# Patient Record
Sex: Female | Born: 1985 | Race: White | Hispanic: No | Marital: Single | Smoking: Former smoker
Health system: Southern US, Community
[De-identification: ages and names within clinical notes are randomized; demographics above are authoritative.]

## PROBLEM LIST (undated history)

## (undated) DIAGNOSIS — J45909 Unspecified asthma, uncomplicated: Secondary | ICD-10-CM

## (undated) DIAGNOSIS — K219 Gastro-esophageal reflux disease without esophagitis: Secondary | ICD-10-CM

## (undated) DIAGNOSIS — G43909 Migraine, unspecified, not intractable, without status migrainosus: Secondary | ICD-10-CM

## (undated) DIAGNOSIS — T7840XA Allergy, unspecified, initial encounter: Secondary | ICD-10-CM

## (undated) DIAGNOSIS — A64 Unspecified sexually transmitted disease: Secondary | ICD-10-CM

## (undated) DIAGNOSIS — F419 Anxiety disorder, unspecified: Secondary | ICD-10-CM

## (undated) DIAGNOSIS — I10 Essential (primary) hypertension: Secondary | ICD-10-CM

## (undated) HISTORY — PX: TONSILLECTOMY: SUR1361

## (undated) HISTORY — DX: Migraine, unspecified, not intractable, without status migrainosus: G43.909

## (undated) HISTORY — DX: Unspecified asthma, uncomplicated: J45.909

## (undated) HISTORY — DX: Allergy, unspecified, initial encounter: T78.40XA

## (undated) HISTORY — DX: Anxiety disorder, unspecified: F41.9

## (undated) HISTORY — DX: Unspecified sexually transmitted disease: A64

## (undated) HISTORY — DX: Gastro-esophageal reflux disease without esophagitis: K21.9

## (undated) HISTORY — DX: Essential (primary) hypertension: I10

---

## 2010-11-01 ENCOUNTER — Inpatient Hospital Stay (INDEPENDENT_AMBULATORY_CARE_PROVIDER_SITE_OTHER)
Admission: RE | Admit: 2010-11-01 | Discharge: 2010-11-01 | Disposition: A | Discharge status: Home / Self Care | Payer: BC Managed Care – PPO | Source: Ambulatory Visit | Attending: Family Medicine | Admitting: Family Medicine

## 2010-11-01 DIAGNOSIS — J029 Acute pharyngitis, unspecified: Secondary | ICD-10-CM

## 2011-10-18 ENCOUNTER — Ambulatory Visit: Payer: BC Managed Care – PPO | Admitting: Family Medicine

## 2011-10-18 VITALS — BP 128/79 | HR 103 | Temp 98.6°F | Resp 12 | Ht 65.0 in | Wt 136.0 lb

## 2011-10-18 DIAGNOSIS — IMO0001 Reserved for inherently not codable concepts without codable children: Secondary | ICD-10-CM

## 2011-10-18 DIAGNOSIS — IMO0002 Reserved for concepts with insufficient information to code with codable children: Secondary | ICD-10-CM

## 2011-10-18 DIAGNOSIS — T7589XA Other specified effects of external causes, initial encounter: Secondary | ICD-10-CM

## 2011-10-18 LAB — POCT WET PREP WITH KOH
Epithelial Wet Prep HPF POC: NEGATIVE
Trichomonas, UA: NEGATIVE
Yeast Wet Prep HPF POC: NEGATIVE

## 2011-10-18 LAB — POCT CBC
Granulocyte percent: 71.1 %G (ref 37–80)
HCT, POC: 39.3 % (ref 37.7–47.9)
Lymph, poc: 2.1 (ref 0.6–3.4)
MCH, POC: 29.4 pg (ref 27–31.2)
MCHC: 31.3 g/dL — AB (ref 31.8–35.4)
MCV: 93.7 fL (ref 80–97)
POC LYMPH PERCENT: 19.6 %L (ref 10–50)
RDW, POC: 14.9 %
WBC: 10.6 10*3/uL — AB (ref 4.6–10.2)

## 2011-10-18 MED ORDER — METRONIDAZOLE 500 MG PO TABS: 500.0000 mg | ORAL_TABLET | Freq: Two times a day (BID) | ORAL | Status: AC

## 2011-10-18 MED ORDER — DOXYCYCLINE HYCLATE 100 MG PO TABS: 100.0000 mg | ORAL_TABLET | Freq: Two times a day (BID) | ORAL | Status: AC

## 2011-10-18 MED ORDER — VALACYCLOVIR HCL 1 G PO TABS: 1000.0000 mg | ORAL_TABLET | Freq: Two times a day (BID) | ORAL | Status: DC

## 2011-10-18 MED ORDER — ONDANSETRON 8 MG PO TBDP: 8.0000 mg | ORAL_TABLET | Freq: Three times a day (TID) | ORAL | Status: AC | PRN

## 2011-10-18 MED ORDER — CEFTRIAXONE SODIUM 1 G IJ SOLR
250.0000 mg | Freq: Once | INTRAMUSCULAR | Status: AC
  Administered 2011-10-18: 250 mg via INTRAMUSCULAR

## 2011-10-18 NOTE — Progress Notes (Signed)
Subjective:    Patient ID: Brandi Navarro, female    DOB: [birth year], 26 y.o.   MRN: [id number]  HPI  1) Presents complaining of ST X 6 days. Looked at the back of her throat and noted "pus"     Pain swallowing.  2) Vaginal discharge, dysuria and pain.  Partner has multiple other partner. Does not use condoms.      Review of Systems  Constitutional: Positive for fever, chills and fatigue.  HENT: Positive for postnasal drip.        Objective:   Physical Exam  Constitutional: She appears well-developed.  HENT:  Mouth/Throat: Oral lesions (multiple ulcerative lesions (B) tonsils ) present.  Cardiovascular: Normal rate, regular rhythm and normal heart sounds.   Pulmonary/Chest: Effort normal and breath sounds normal.  Genitourinary: Uterus normal. There is rash (vesicular lesions) on the right labia. There is rash (vesicular lesions) on the left labia. Cervix exhibits friability. Cervix exhibits no motion tenderness. Right adnexum displays no mass and no tenderness. Left adnexum displays no mass and no tenderness. Vaginal discharge found.  Lymphadenopathy:    She has cervical adenopathy.       Right: Inguinal adenopathy present.       Left: Inguinal adenopathy present.     Results for orders placed in visit on 10/18/11  HIV ANTIBODY (ROUTINE TESTING)      Component Value Range   HIV NON REACTIVE  NON REACTIVE   GC/CHLAMYDIA PROBE AMP, GENITAL      Component Value Range   Chlamydia, DNA Probe NEGATIVE     GC Probe Amp, Genital NEGATIVE    POCT WET PREP WITH KOH      Component Value Range   Trichomonas, UA Negative     Clue Cells Wet Prep HPF POC tntc     Epithelial Wet Prep HPF POC neg     Yeast Wet Prep HPF POC neg     Bacteria Wet Prep HPF POC 5+     RBC Wet Prep HPF POC neg     WBC Wet Prep HPF POC tntc     KOH Prep POC Negative    RPR      Component Value Range   RPR NON REAC  NON REAC   HSV(HERPES SMPLX)ABS-I+II(IGG+IGM)-BLD      Component Value Range   HSV 1  Glycoprotein G Ab, IgG <0.10     HSV 2 Glycoprotein G Ab, IgG 0.16     Herpes Simplex Vrs I&II-IgM Ab (EIA) 1.88 (*)   HERPES SIMPLEX VIRUS CULTURE      Component Value Range   Organism ID, 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    POCT CBC      Component Value Range   WBC 10.6 (*) 4.6 - 10.2 (K/uL)   Lymph, poc 2.1  0.6 - 3.4    POC LYMPH PERCENT 19.6  10 - 50 (%L)   MID (cbc) 1.0 (*) 0 - 0.9    POC MID % 9.3  0 - 12 (%M)   POC Granulocyte 7.5 (*) 2 - 6.9    Granulocyte percent 71.1  37 - 80 (%G)   RBC 4.19  4.04 - 5.48 (M/uL)   Hemoglobin 12.3  12.2 - 16.2 (g/dL)   HCT, POC 40.9  81.1 - 47.9 (%)   MCV 93.7  80 - 97 (fL)   MCH, POC 29.4  27 - 31.2 (pg)   MCHC 31.3 (*) 31.8 - 35.4 (g/dL)   RDW, POC 14.9  Platelet Count, POC 294  142 - 424 (K/uL)   MPV 8.7  0 - 99.8 (fL)        Assessment & Plan:   1. Exposure  HIV antibody, GC/chlamydia probe amp, genital, POCT Wet Prep with KOH, RPR, HSV(herpes smplx)abs-1+2(IgG+IgM)-bld, Herpes simplex virus culture, cefTRIAXone (ROCEPHIN) injection 250 mg, POCT CBC, valACYclovir (VALTREX) 1000 MG tablet, metroNIDAZOLE (FLAGYL) 500 MG tablet, doxycycline (VIBRA-TABS) 100 MG tablet, ondansetron (ZOFRAN ODT) 8 MG disintegrating tablet   Lengthy conversation regarding diagnosis of HSV; await culture results

## 2011-10-20 LAB — HSV(HERPES SMPLX)ABS-I+II(IGG+IGM)-BLD: Herpes Simplex Vrs I&II-IgM Ab (EIA): 1.88 INDEX — ABNORMAL HIGH

## 2011-10-21 LAB — HERPES SIMPLEX VIRUS CULTURE: Organism ID, Bacteria: DETECTED

## 2011-10-24 ENCOUNTER — Telehealth: Payer: Self-pay

## 2011-10-24 ENCOUNTER — Other Ambulatory Visit: Payer: Self-pay | Admitting: Family Medicine

## 2011-10-24 DIAGNOSIS — B009 Herpesviral infection, unspecified: Secondary | ICD-10-CM

## 2011-10-24 MED ORDER — VALACYCLOVIR HCL 1 G PO TABS: 1000.0000 mg | ORAL_TABLET | Freq: Every day | ORAL | Status: AC

## 2011-10-24 NOTE — Telephone Encounter (Signed)
Dr. Hal Hope, an you pls review pt's labs. Thanks

## 2011-10-24 NOTE — Telephone Encounter (Signed)
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 RX Valtrex 1 gm daily . QS X 1 year

## 2011-10-24 NOTE — Telephone Encounter (Signed)
PATIENT IS REQUESTING HER LAB RESULTS SHE HAD DONE LAST Sunday. SHE SAW DR. Hal Hope AND HAS NOT HEARD ANYTHING BACK FROM [name] YET. BEST PHONE [phone]   (CELL)       PHARMACY CHOICE IS [facility] ([address])  MBC

## 2011-11-27 ENCOUNTER — Other Ambulatory Visit: Payer: Self-pay | Admitting: Family Medicine

## 2011-11-27 MED ORDER — NORGESTIM-ETH ESTRAD TRIPHASIC 0.18/0.215/0.25 MG-35 MCG PO TABS: 1.0000 | ORAL_TABLET | Freq: Every day | ORAL | Status: DC

## 2012-11-09 ENCOUNTER — Telehealth: Payer: Self-pay | Admitting: Certified Nurse Midwife

## 2012-11-09 MED ORDER — VALACYCLOVIR HCL 500 MG PO TABS: 500.0000 mg | ORAL_TABLET | Freq: Every day | ORAL | Status: DC

## 2012-11-09 NOTE — Telephone Encounter (Signed)
Valtrex refill needed please. Patient having symptoms and needs as soon as possible please. [facility] [phone]

## 2012-11-09 NOTE — Telephone Encounter (Signed)
rx for valtrex 500mg 1po qd #30/6 refills sent to pts pharmacy. Pt is aware.  

## 2012-11-09 NOTE — Telephone Encounter (Signed)
rx for valtrex 500mg  1po qd #30/6 refills sent to pts pharmacy. Pt is aware.

## 2012-11-16 ENCOUNTER — Encounter: Payer: Self-pay | Admitting: Certified Nurse Midwife

## 2012-11-17 ENCOUNTER — Other Ambulatory Visit: Payer: Self-pay | Admitting: Certified Nurse Midwife

## 2012-11-17 ENCOUNTER — Encounter: Payer: Self-pay | Admitting: Certified Nurse Midwife

## 2012-11-17 ENCOUNTER — Ambulatory Visit (INDEPENDENT_AMBULATORY_CARE_PROVIDER_SITE_OTHER): Payer: BC Managed Care – PPO | Admitting: Certified Nurse Midwife

## 2012-11-17 VITALS — BP 100/62 | Ht 64.75 in | Wt 168.0 lb

## 2012-11-17 DIAGNOSIS — IMO0001 Reserved for inherently not codable concepts without codable children: Secondary | ICD-10-CM

## 2012-11-17 DIAGNOSIS — Z0000 Encounter for general adult medical examination without abnormal findings: Secondary | ICD-10-CM

## 2012-11-17 DIAGNOSIS — Z01419 Encounter for gynecological examination (general) (routine) without abnormal findings: Secondary | ICD-10-CM

## 2012-11-17 DIAGNOSIS — Z309 Encounter for contraceptive management, unspecified: Secondary | ICD-10-CM

## 2012-11-17 DIAGNOSIS — R5381 Other malaise: Secondary | ICD-10-CM

## 2012-11-17 DIAGNOSIS — B009 Herpesviral infection, unspecified: Secondary | ICD-10-CM

## 2012-11-17 LAB — CBC
Hemoglobin: 12.5 g/dL (ref 12.0–15.0)
MCH: 31.3 pg (ref 26.0–34.0)
RBC: 3.99 MIL/uL (ref 3.87–5.11)

## 2012-11-17 MED ORDER — VALACYCLOVIR HCL 500 MG PO TABS: 500.0000 mg | ORAL_TABLET | Freq: Every day | ORAL | Status: DC

## 2012-11-17 MED ORDER — NORGESTIM-ETH ESTRAD TRIPHASIC 0.18/0.215/0.25 MG-35 MCG PO TABS: 1.0000 | ORAL_TABLET | Freq: Every day | ORAL | Status: DC

## 2012-11-17 NOTE — Progress Notes (Signed)
27 y.o. G0P0000 Single Caucasian Fe here for annual exam. Periods normal, no issues.  Contraception working well, no problems.  Complaining of fatigue over the past 3 months, eating mainly vegetarian diet.  Some fish and chicken. Recent HSV outbreak , had run out of Valtrex for suppression.  Desires STD screening, due partner change. No other health issues.  Patient's last menstrual period was 11/02/2012.          Sexually active: yes  The current method of family planning is OCP (estrogen/progesterone).    Exercising: yes  walking Smoker:  yes  Health Maintenance: Pap: 11-17-11 neg MMG:  none Colonoscopy:  none BMD:   none TDaP:  Unsure (probably when started college per patient) Labs: Hgb-12.7 Self breast exam: not done   reports that she has been smoking Cigars.  She does not have any smokeless tobacco history on file. She reports that she drinks about 2.0 ounces of alcohol per week. She reports that she does not use illicit drugs.  Past Medical History  Diagnosis Date  . STD (sexually transmitted disease)     HSVII    No past surgical history on file.  Current Outpatient Prescriptions  Medication Sig Dispense Refill  . norethindrone-ethinyl estradiol (TRIPHASIL,CYCLAFEM,ALYACEN) 0.5/0.75/1-35 MG-MCG tablet Take 1 tablet by mouth daily.      . Norgestimate-Ethinyl Estradiol Triphasic (TRI-SPRINTEC) 0.18/0.215/0.25 MG-35 MCG tablet Take 1 tablet by mouth daily.  1 Package  2  . valACYclovir (VALTREX) 500 MG tablet Take 1 tablet (500 mg total) by mouth daily.  30 tablet  6   No current facility-administered medications for this visit.    Family History  Problem Relation Age of Onset  . Diabetes Maternal Grandfather   . Hypertension Maternal Grandfather     ROS:  Pertinent items are noted in HPI.  Otherwise, a comprehensive ROS was negative.  Exam:   Ht 5' 4.75" (1.645 m)  Wt 168 lb (76.204 kg)  BMI 28.16 kg/m2  LMP 11/02/2012 Height: 5' 4.75" (164.5 cm)  Ht Readings  from Last 3 Encounters:  11/17/12 5' 4.75" (1.645 m)  10/18/11 5\' 5"  (1.651 m)    General appearance: alert, cooperative and appears stated age Head: Normocephalic, without obvious abnormality, atraumatic Neck: no adenopathy, supple, symmetrical, trachea midline and thyroid normal to inspection and palpation Lungs: clear to auscultation bilaterally Breasts: normal appearance, no masses or tenderness, No nipple retraction or dimpling, No nipple discharge or bleeding, No axillary or supraclavicular adenopathy Heart: regular rate and rhythm Abdomen: soft, non-tender; no masses,  no organomegaly Extremities: extremities normal, atraumatic, no cyanosis or edema Skin: Skin color, texture, turgor normal. No rashes or lesions Lymph nodes: Cervical, supraclavicular, and axillary nodes normal. No abnormal inguinal nodes palpated Neurologic: Grossly normal   Pelvic: External genitalia:  no lesions              Urethra:  normal appearing urethra with no masses, tenderness or lesions              Bartholin's and Skene's: normal                 Vagina: normal appearing vagina with normal color and discharge, no lesions              Cervix: normal, non tender              Pap taken: no Bimanual Exam:  Uterus:  anteverted              Adnexa: normal  adnexa and no mass, fullness, tenderness               Rectovaginal: Confirms               Anus:  normal sphincter tone, no lesions  A:  Well Woman with normal exam  Contraception: Desires OCP  Fatigue ? Diet related  History of HSV11 needs  Rx.  P: Reviewed health and wellness pertinent to exam.  Rx Trisprintec see order  Work on more protein based diet and not just carbohydrates.  Patient agrees to work on.  Rx Valtrex see order  Labs:CBC, TSH,HIV, RPR, GC, Chlamydia             Pap smear as per guidelines   pap smear not taken today counseled on adequate intake of calcium and vitamin D, diet and exercise, Kegel's exercises  return annually  or prn  An After Visit Summary was printed and given to the patient.  Reviewed, TL

## 2012-11-18 LAB — HIV ANTIBODY (ROUTINE TESTING W REFLEX): HIV: NONREACTIVE

## 2012-11-18 LAB — RPR

## 2012-11-23 LAB — IPS N GONORRHOEA AND CHLAMYDIA BY PCR

## 2012-11-29 ENCOUNTER — Telehealth: Payer: Self-pay

## 2012-11-29 NOTE — Telephone Encounter (Signed)
LMTCB

## 2012-11-29 NOTE — Telephone Encounter (Signed)
Patient returning Joy's call.  °

## 2012-11-29 NOTE — Telephone Encounter (Signed)
Patient notified of results.

## 2012-11-29 NOTE — Telephone Encounter (Signed)
Message copied by Eliezer Bottom on Tue Nov 29, 2012 10:33 AM ------      Message from: Ria Comment R      Created: Fri Nov 25, 2012  4:15 PM       Let patient know negative ------

## 2013-05-04 ENCOUNTER — Other Ambulatory Visit: Payer: Self-pay

## 2013-11-22 ENCOUNTER — Ambulatory Visit (INDEPENDENT_AMBULATORY_CARE_PROVIDER_SITE_OTHER): Payer: BC Managed Care – PPO | Admitting: Gynecology

## 2013-11-22 ENCOUNTER — Encounter: Payer: Self-pay | Admitting: Gynecology

## 2013-11-22 VITALS — BP 104/68 | HR 64 | Resp 16 | Ht 64.25 in | Wt 148.0 lb

## 2013-11-22 DIAGNOSIS — L709 Acne, unspecified: Secondary | ICD-10-CM

## 2013-11-22 DIAGNOSIS — N63 Unspecified lump in unspecified breast: Secondary | ICD-10-CM

## 2013-11-22 DIAGNOSIS — Z309 Encounter for contraceptive management, unspecified: Secondary | ICD-10-CM

## 2013-11-22 DIAGNOSIS — B009 Herpesviral infection, unspecified: Secondary | ICD-10-CM

## 2013-11-22 DIAGNOSIS — N6310 Unspecified lump in the right breast, unspecified quadrant: Secondary | ICD-10-CM

## 2013-11-22 DIAGNOSIS — L708 Other acne: Secondary | ICD-10-CM

## 2013-11-22 DIAGNOSIS — N644 Mastodynia: Secondary | ICD-10-CM

## 2013-11-22 DIAGNOSIS — Z01419 Encounter for gynecological examination (general) (routine) without abnormal findings: Secondary | ICD-10-CM

## 2013-11-22 DIAGNOSIS — Z0000 Encounter for general adult medical examination without abnormal findings: Secondary | ICD-10-CM

## 2013-11-22 LAB — POCT URINALYSIS DIPSTICK
BILIRUBIN UA: NEGATIVE
Glucose, UA: NEGATIVE
KETONES UA: NEGATIVE
LEUKOCYTES UA: NEGATIVE
Nitrite, UA: NEGATIVE
PH UA: 5
PROTEIN UA: NEGATIVE
RBC UA: NEGATIVE
Urobilinogen, UA: NEGATIVE

## 2013-11-22 MED ORDER — LEVONORGESTREL-ETHINYL ESTRAD 0.1-20 MG-MCG PO TABS: 1.0000 | ORAL_TABLET | Freq: Every day | ORAL | Status: DC

## 2013-11-22 MED ORDER — VALACYCLOVIR HCL 500 MG PO TABS: 500.0000 mg | ORAL_TABLET | Freq: Every day | ORAL | Status: DC

## 2013-11-22 NOTE — Patient Instructions (Signed)
cetaphil liquid for face

## 2013-11-22 NOTE — Progress Notes (Signed)
Patient scheduled for R breast US at [facility] for 11/28/13 at 1600 for R Breast Tenderness. Agreeable to time/date/location.  Mammogram hold placed.

## 2013-11-22 NOTE — Progress Notes (Signed)
28 y.o. single Caucasian female   G0P0000 here for annual exam. Pt is currently sexually active.  She reports using condoms occ.  First sexual activity at 28 years old, 21 number of lifetime partners.   Same partner as last year.  Pt is on phentirmine since end of Dec 2104, pt has lost 20#, cycles are good on ocp.  Pt reports some breakout around cycles for the past yr, had issue on loestrin   Patient's last menstrual period was 10/25/2013.          Sexually active: yes  The current method of family planning is OCP (estrogen/progesterone).    Exercising: yes  cardio & weights Last pap: 11-17-11 neg, 2011 negative Alcohol: 3-4 a week Tobacco:none Drugs:none Gardisil: yes, completed: 05-20-12 SBE: done occ Poct urine-neg   Health Maintenance  Topic Date Due  . Pap Smear  11/30/2005  . Tetanus/tdap  12/01/2006  . Influenza Vaccine  01/27/2014    Family History  Problem Relation Age of Onset  . Diabetes Maternal Grandfather   . Hypertension Maternal Grandfather   . Anemia Mother   . Anemia Sister   . Anemia Maternal Grandmother   . Cancer Other     There are no active problems to display for this patient.   Past Medical History  Diagnosis Date  . STD (sexually transmitted disease)     HSVII  . Asthma     History reviewed. No pertinent past surgical history.  Allergies: Review of patient's allergies indicates no known allergies.  Current Outpatient Prescriptions  Medication Sig Dispense Refill  . Cyanocobalamin (VITAMIN B 12 PO) Take by mouth daily.      . Multiple Vitamins-Minerals (MULTIVITAMIN PO) Take by mouth daily.      . Norgestimate-Ethinyl Estradiol Triphasic (TRI-SPRINTEC) 0.18/0.215/0.25 MG-35 MCG tablet Take 1 tablet by mouth daily.  1 Package  12  . phentermine 37.5 MG capsule Take 37.5 mg by mouth every morning.      . valACYclovir (VALTREX) 500 MG tablet Take 1 tablet (500 mg total) by mouth daily. Take 2 tablets twice daily at onset of outbreak  45  tablet  12   No current facility-administered medications for this visit.    ROS: Pertinent items are noted in HPI.  Exam:    BP 104/68  Pulse 64  Resp 16  Ht 5' 4.25" (1.632 m)  Wt 148 lb (67.132 kg)  BMI 25.21 kg/m2  LMP 10/25/2013 Weight change: @WEIGHTCHANGE @ Last 3 height recordings:  Ht Readings from Last 3 Encounters:  11/22/13 5' 4.25" (1.632 m)  11/17/12 5' 4.75" (1.645 m)  10/18/11 5\' 5"  (1.651 m)   General appearance: alert, cooperative and appears stated age Head: Normocephalic, without obvious abnormality, atraumatic Neck: no adenopathy, no carotid bruit, no JVD, supple, symmetrical, trachea midline and thyroid not enlarged, symmetric, no tenderness/mass/nodules Lungs: clear to auscultation bilaterally Breasts: normal appearance, no masses or tenderness, on left, right tenderness lateral at 10 o'clock, no skin changes Heart: regular rate and rhythm, S1, S2 normal, no murmur, click, rub or gallop Abdomen: soft, non-tender; bowel sounds normal; no masses,  no organomegaly Extremities: extremities normal, atraumatic, no cyanosis or edema Skin: Skin color, texture, turgor normal. No rashes or lesions Lymph nodes: Cervical, supraclavicular, and axillary nodes normal. no inguinal nodes palpated Neurologic: Grossly normal   Pelvic: External genitalia:  no lesions              Urethra: normal appearing urethra with no masses, tenderness or lesions  Bartholins and Skenes: Bartholin's, Urethra, Skene's normal                 Vagina: normal appearing vagina with normal color and discharge, no lesions              Cervix: normal appearance              Pap taken: no        Bimanual Exam:  Uterus:  uterus is normal size, shape, consistency and nontender                                      Adnexa:    normal adnexa in size, nontender and no masses                                      Rectovaginal: Confirms                                      Anus:  normal  sphincter tone, no lesions  A: well woman no contraindication to continue use of oral contraceptives Acne HSV infection Right breast tenderness     P: pap smear next year Change ocp to alesse, cetaphil liquid Refill valtrex Breast u/s on right counseled on breast self exam, use and side effects of OCP's, adequate intake of calcium and vitamin D, diet and exercise return annually or prn Discussed STD prevention, regular condom use.    An After Visit Summary was printed and given to the patient.

## 2013-11-28 ENCOUNTER — Ambulatory Visit
Admission: RE | Admit: 2013-11-28 | Discharge: 2013-11-28 | Disposition: A | Discharge status: Home / Self Care | Payer: BC Managed Care – PPO | Source: Ambulatory Visit | Attending: Gynecology | Admitting: Gynecology

## 2013-11-28 DIAGNOSIS — N644 Mastodynia: Secondary | ICD-10-CM

## 2014-02-05 ENCOUNTER — Encounter: Payer: Self-pay | Admitting: Gynecology

## 2014-02-07 ENCOUNTER — Telehealth: Payer: Self-pay

## 2014-02-07 ENCOUNTER — Encounter: Payer: Self-pay | Admitting: Gynecology

## 2014-02-07 NOTE — Telephone Encounter (Signed)
Left message to call Ashden Sonnenberg at [phone]. 

## 2014-02-07 NOTE — Telephone Encounter (Signed)
Spoke with patient. Advised of message as seen below from Dr.Lathrop. Patient states that she has recently switched insurance and is not sure if she will be covered to come here. Advised patient to send a copy of new insurance to office and that we could check. Patient is agreeable and will fax a copy. Fax number provided. Advised would call patient back with further information and get her scheduled from there. Patient agreeable.

## 2014-02-07 NOTE — Telephone Encounter (Signed)
Message copied by Jannet AskewHINES, Eilish Mcdaniel E on Wed Feb 07, 2014  9:12 AM ------      Message from: Douglass RiversLATHROP, TRACY      Created: Wed Feb 07, 2014  9:04 AM       Just sent pt a mychart message, i think she should come in for consult due to issues with her ocp, can you call her?      TL ------

## 2014-02-07 NOTE — Telephone Encounter (Signed)
Spoke with patient. Advised insurance is one that we take here per front office. Appointment scheduled for Monday at 2:30pm with Dr.Lathrop. Patient agreeable to date and time.  Routing to provider for final review. Patient agreeable to disposition. Will close encounter

## 2014-02-07 NOTE — Telephone Encounter (Signed)
Patient is calling kaitlyn back °

## 2014-02-07 NOTE — Telephone Encounter (Signed)
Left message to call Driana Dazey at [phone].   Advise patient received insurance and we do except this insurance as advised from the front office staff.

## 2014-02-12 ENCOUNTER — Ambulatory Visit (INDEPENDENT_AMBULATORY_CARE_PROVIDER_SITE_OTHER): Payer: No Typology Code available for payment source | Admitting: Gynecology

## 2014-02-12 VITALS — BP 112/78 | Resp 18 | Ht 64.25 in | Wt 147.0 lb

## 2014-02-12 DIAGNOSIS — L709 Acne, unspecified: Secondary | ICD-10-CM

## 2014-02-12 DIAGNOSIS — L708 Other acne: Secondary | ICD-10-CM

## 2014-02-12 DIAGNOSIS — Z3041 Encounter for surveillance of contraceptive pills: Secondary | ICD-10-CM

## 2014-02-12 MED ORDER — CLINDAMYCIN PHOSPHATE 1 % EX LOTN: TOPICAL_LOTION | Freq: Two times a day (BID) | CUTANEOUS | Status: DC

## 2014-02-12 MED ORDER — DROSPIRENONE-ETHINYL ESTRADIOL 3-0.02 MG PO TABS: 1.0000 | ORAL_TABLET | Freq: Every day | ORAL | Status: DC

## 2014-02-12 NOTE — Progress Notes (Signed)
Pt here reporting worsening of her acne.  Pt was on generic of ortho-tri-cyclen and did well on the first week of her pack but had issues by the last week.  As a result we changed her alesse which also had FDA approval for acne, pt states that her acne is worse than on tri-cyclen.  She switched to cetophil liquid and lotion as recommended, limited harsh atringents. Pt also was on loestrin with worse results. Pt was started on ocp when she was a teen due to markedly irregular cycles. Review of chart, no lab work re testosterone. Pt is ir[place]/english descent, denies issues with hirsutism.   Recommend checking testosterone, free and total 17-oh-p DHEAS Will try yaz Cleocin T as needed F/u prn Questions addressed  6632m spent counseling, >50% face to face

## 2014-02-13 LAB — TESTOSTERONE, FREE, TOTAL, SHBG
SEX HORMONE BINDING: 132 nmol/L — AB (ref 18–114)
TESTOSTERONE FREE: 2.7 pg/mL (ref 0.6–6.8)
TESTOSTERONE-% FREE: 0.6 % (ref 0.4–2.4)
TESTOSTERONE: 42 ng/dL (ref 10–70)

## 2014-02-13 LAB — DHEA-SULFATE: DHEA-SO4: 129 ug/dL (ref 35–430)

## 2014-02-16 LAB — 17-HYDROXYPROGESTERONE: 17-OH-Progesterone, LC/MS/MS: <8 ng/dL

## 2014-11-27 ENCOUNTER — Ambulatory Visit (INDEPENDENT_AMBULATORY_CARE_PROVIDER_SITE_OTHER): Payer: No Typology Code available for payment source | Admitting: Certified Nurse Midwife

## 2014-11-27 ENCOUNTER — Encounter: Payer: Self-pay | Admitting: Certified Nurse Midwife

## 2014-11-27 VITALS — BP 110/64 | HR 68 | Resp 16 | Ht 64.25 in | Wt 152.0 lb

## 2014-11-27 DIAGNOSIS — Z01419 Encounter for gynecological examination (general) (routine) without abnormal findings: Secondary | ICD-10-CM | POA: Diagnosis not present

## 2014-11-27 DIAGNOSIS — Z124 Encounter for screening for malignant neoplasm of cervix: Secondary | ICD-10-CM

## 2014-11-27 DIAGNOSIS — N943 Premenstrual tension syndrome: Secondary | ICD-10-CM

## 2014-11-27 DIAGNOSIS — Z0000 Encounter for general adult medical examination without abnormal findings: Secondary | ICD-10-CM

## 2014-11-27 NOTE — Progress Notes (Signed)
Reviewed personally.  M. Suzanne Rhyder Bratz, MD.  

## 2014-11-27 NOTE — Patient Instructions (Signed)
General topics  Next pap or exam is  due in 1 year Take a Women's multivitamin Take 1200 mg. of calcium daily - prefer dietary If any concerns in interim to call back  Breast Self-Awareness Practicing breast self-awareness may pick up problems early, prevent significant medical complications, and possibly save your life. By practicing breast self-awareness, you can become familiar with how your breasts look and feel and if your breasts are changing. This allows you to notice changes early. It can also offer you some reassurance that your breast health is good. One way to learn what is normal for your breasts and whether your breasts are changing is to do a breast self-exam. If you find a lump or something that was not present in the past, it is best to contact your caregiver right away. Other findings that should be evaluated by your caregiver include nipple discharge, especially if it is bloody; skin changes or reddening; areas where the skin seems to be pulled in (retracted); or new lumps and bumps. Breast pain is seldom associated with cancer (malignancy), but should also be evaluated by a caregiver. BREAST SELF-EXAM The best time to examine your breasts is 5 7 days after your menstrual period is over.  ExitCare Patient Information 2013 [employer].   Exercise to Stay Healthy Exercise helps you become and stay healthy. EXERCISE IDEAS AND TIPS Choose exercises that:  You enjoy.  Fit into your day. You do not need to exercise really hard to be healthy. You can do exercises at a slow or medium level and stay healthy. You can:  Stretch before and after working out.  Try yoga, Pilates, or tai chi.  Lift weights.  Walk fast, swim, jog, run, climb stairs, bicycle, dance, or rollerskate.  Take aerobic classes. Exercises that burn about 150 calories:  Running 1  miles in 15 minutes.  Playing volleyball for 45 to 60 minutes.  Washing and waxing a car for 45 to 60  minutes.  Playing touch football for 45 minutes.  Walking 1  miles in 35 minutes.  Pushing a stroller 1  miles in 30 minutes.  Playing basketball for 30 minutes.  Raking leaves for 30 minutes.  Bicycling 5 miles in 30 minutes.  Walking 2 miles in 30 minutes.  Dancing for 30 minutes.  Shoveling snow for 15 minutes.  Swimming laps for 20 minutes.  Walking up stairs for 15 minutes.  Bicycling 4 miles in 15 minutes.  Gardening for 30 to 45 minutes.  Jumping rope for 15 minutes.  Washing windows or floors for 45 to 60 minutes. Document Released: 07/18/2010 Document Revised: 09/07/2011 Document Reviewed: 07/18/2010 ExitCare Patient Information 2013 [employer].   Other topics ( that may be useful information):    Sexually Transmitted Disease Sexually transmitted disease (STD) refers to any infection that is passed from person to person during sexual activity. This may happen by way of saliva, semen, blood, vaginal mucus, or urine. Common STDs include:  Gonorrhea.  Chlamydia.  Syphilis.  HIV/AIDS.  Genital herpes.  Hepatitis B and C.  Trichomonas.  Human papillomavirus (HPV).  Pubic lice. CAUSES  An STD may be spread by bacteria, virus, or parasite. A person can get an STD by:  Sexual intercourse with an infected person.  Sharing sex toys with an infected person.  Sharing needles with an infected person.  Having intimate contact with the genitals, mouth, or rectal areas of an infected person. SYMPTOMS  Some people may not have any symptoms, but   they can still pass the infection to others. Different STDs have different symptoms. Symptoms include:  Painful or bloody urination.  Pain in the pelvis, abdomen, vagina, anus, throat, or eyes.  Skin rash, itching, irritation, growths, or sores (lesions). These usually occur in the genital or anal area.  Abnormal vaginal discharge.  Penile discharge in men.  Soft, flesh-colored skin growths in the  genital or anal area.  Fever.  Pain or bleeding during sexual intercourse.  Swollen glands in the groin area.  Yellow skin and eyes (jaundice). This is seen with hepatitis. DIAGNOSIS  To make a diagnosis, your caregiver may:  Take a medical history.  Perform a physical exam.  Take a specimen (culture) to be examined.  Examine a sample of discharge under a microscope.  Perform blood test TREATMENT   Chlamydia, gonorrhea, trichomonas, and syphilis can be cured with antibiotic medicine.  Genital herpes, hepatitis, and HIV can be treated, but not cured, with prescribed medicines. The medicines will lessen the symptoms.  Genital warts from HPV can be treated with medicine or by freezing, burning (electrocautery), or surgery. Warts may come back.  HPV is a virus and cannot be cured with medicine or surgery.However, abnormal areas may be followed very closely by your caregiver and may be removed from the cervix, vagina, or vulva through office procedures or surgery. If your diagnosis is confirmed, your recent sexual partners need treatment. This is true even if they are symptom-free or have a negative culture or evaluation. They should not have sex until their caregiver says it is okay. HOME CARE INSTRUCTIONS  All sexual partners should be informed, tested, and treated for all STDs.  Take your antibiotics as directed. Finish them even if you start to feel better.  Only take over-the-counter or prescription medicines for pain, discomfort, or fever as directed by your caregiver.  Rest.  Eat a balanced diet and drink enough fluids to keep your urine clear or pale yellow.  Do not have sex until treatment is completed and you have followed up with your caregiver. STDs should be checked after treatment.  Keep all follow-up appointments, Pap tests, and blood tests as directed by your caregiver.  Only use latex condoms and water-soluble lubricants during sexual activity. Do not use  petroleum jelly or oils.  Avoid alcohol and illegal drugs.  Get vaccinated for HPV and hepatitis. If you have not received these vaccines in the past, talk to your caregiver about whether one or both might be right for you.  Avoid risky sex practices that can break the skin. The only way to avoid getting an STD is to avoid all sexual activity.Latex condoms and dental dams (for oral sex) will help lessen the risk of getting an STD, but will not completely eliminate the risk. SEEK MEDICAL CARE IF:   You have a fever.  You have any new or worsening symptoms. Document Released: 09/05/2002 Document Revised: 09/07/2011 Document Reviewed: 09/12/2010 [facility] Patient Information 2013 [place].    Domestic Abuse You are being battered or abused if someone close to you hits, pushes, or physically hurts you in any way. You also are being abused if you are forced into activities. You are being sexually abused if you are forced to have sexual contact of any kind. You are being emotionally abused if you are made to feel worthless or if you are constantly threatened. It is important to remember that help is available. No one has the right to abuse you. PREVENTION OF FURTHER  ABUSE  Learn the warning signs of danger. This varies with situations but may include: the use of alcohol, threats, isolation from friends and family, or forced sexual contact. Leave if you feel that violence is going to occur.  If you are attacked or beaten, report it to the police so the abuse is documented. You do not have to press charges. The police can protect you while you or the attackers are leaving. Get the officer's name and badge number and a copy of the report.  Find someone you can trust and tell them what is happening to you: your caregiver, a nurse, clergy member, close friend or family member. Feeling ashamed is natural, but remember that you have done nothing wrong. No one deserves abuse. Document Released:  06/12/2000 Document Revised: 09/07/2011 Document Reviewed: 08/21/2010 ExitCare Patient Information 2013 [employer].    How Much is Too Much Alcohol? Drinking too much alcohol can cause injury, accidents, and health problems. These types of problems can include:   Car crashes.  Falls.  Family fighting (domestic violence).  Drowning.  Fights.  Injuries.  Burns.  Damage to certain organs.  Having a baby with birth defects. ONE DRINK CAN BE TOO MUCH WHEN YOU ARE:  Working.  Pregnant or breastfeeding.  Taking medicines. Ask your doctor.  Driving or planning to drive. If you or someone you know has a drinking problem, get help from a doctor.  Document Released: 04/11/2009 Document Revised: 09/07/2011 Document Reviewed: 04/11/2009 ExitCare Patient Information 2013 [employer]Care[employer].   Smoking Hazards Smoking cigarettes is extremely bad for your health. Tobacco smoke has over 200 known poisons in it. There are over 60 chemicals in tobacco smoke that cause cancer. Some of the chemicals found in cigarette smoke include:   Cyanide.  Benzene.  Formaldehyde.  Methanol (wood alcohol).  Acetylene (fuel used in welding torches).  Ammonia. Cigarette smoke also contains the poisonous gases nitrogen oxide and carbon monoxide.  Cigarette smokers have an increased risk of many serious medical problems and Smoking causes approximately:  90% of all lung cancer deaths in men.  80% of all lung cancer deaths in women.  90% of deaths from chronic obstructive lung disease. Compared with nonsmokers, smoking increases the risk of:  Coronary heart disease by 2 to 4 times.  Stroke by 2 to 4 times.  Men developing lung cancer by 23 times.  Women developing lung cancer by 13 times.  Dying from chronic obstructive lung diseases by 12 times.  . Smoking is the most preventable cause of death and disease in our society.  WHY IS SMOKING ADDICTIVE?  Nicotine is the chemical  agent in tobacco that is capable of causing addiction or dependence.  When you smoke and inhale, nicotine is absorbed rapidly into the bloodstream through your lungs. Nicotine absorbed through the lungs is capable of creating a powerful addiction. Both inhaled and non-inhaled nicotine may be addictive.  Addiction studies of cigarettes and spit tobacco show that addiction to nicotine occurs mainly during the teen years, when young people begin using tobacco products. WHAT ARE THE BENEFITS OF QUITTING?  There are many health benefits to quitting smoking.   Likelihood of developing cancer and heart disease decreases. Health improvements are seen almost immediately.  Blood pressure, pulse rate, and breathing patterns start returning to normal soon after quitting. QUITTING SMOKING   American Lung Association - 1-800-LUNGUSA  American Cancer Society - 1-800-ACS-2345 Document Released: 07/23/2004 Document Revised: 09/07/2011 Document Reviewed: 03/27/2009 ExitCare Patient Information 2013 ExitCare,   LLC.   Stress Management Stress is a state of physical or mental tension that often results from changes in your life or normal routine. Some common causes of stress are:  Death of a loved one.  Injuries or severe illnesses.  Getting fired or changing jobs.  Moving into a new home. Other causes may be:  Sexual problems.  Business or financial losses.  Taking on a large debt.  Regular conflict with someone at home or at work.  Constant tiredness from lack of sleep. It is not just bad things that are stressful. It may be stressful to:  Win the lottery.  Get married.  Buy a new car. The amount of stress that can be easily tolerated varies from person to person. Changes generally cause stress, regardless of the types of change. Too much stress can affect your health. It may lead to physical or emotional problems. Too little stress (boredom) may also become stressful. SUGGESTIONS TO  REDUCE STRESS:  Talk things over with your family and friends. It often is helpful to share your concerns and worries. If you feel your problem is serious, you may want to get help from a professional counselor.  Consider your problems one at a time instead of lumping them all together. Trying to take care of everything at once may seem impossible. List all the things you need to do and then start with the most important one. Set a goal to accomplish 2 or 3 things each day. If you expect to do too many in a single day you will naturally fail, causing you to feel even more stressed.  Do not use alcohol or drugs to relieve stress. Although you may feel better for a short time, they do not remove the problems that caused the stress. They can also be habit forming.  Exercise regularly - at least 3 times per week. Physical exercise can help to relieve that "uptight" feeling and will relax you.  The shortest distance between despair and hope is often a good night's sleep.  Go to bed and get up on time allowing yourself time for appointments without being rushed.  Take a short "time-out" period from any stressful situation that occurs during the day. Close your eyes and take some deep breaths. Starting with the muscles in your face, tense them, hold it for a few seconds, then relax. Repeat this with the muscles in your neck, shoulders, hand, stomach, back and legs.  Take good care of yourself. Eat a balanced diet and get plenty of rest.  Schedule time for having fun. Take a break from your daily routine to relax. HOME CARE INSTRUCTIONS   Call if you feel overwhelmed by your problems and feel you can no longer manage them on your own.  Return immediately if you feel like hurting yourself or someone else. Document Released: 12/09/2000 Document Revised: 09/07/2011 Document Reviewed: 08/01/2007 [facility] Patient Information 2013 [place].  Premenstrual Syndrome Premenstrual syndrome (PMS) is a  condition that consists of physical, emotional, and behavioral symptoms that affect women of childbearing age. PMS occurs 5-14 days before the start of a menstrual period and often recurs in a predictable pattern. The symptoms go away a few days after the menstrual period starts. PMS can interfere in many ways with normal daily activities and can range from mild to severe. When PMS is considered severe, it may be diagnosed as premenstrual dysphoric disorder (PMDD). A small percentage of women are affected by PMS symptoms and an even smaller  percentage of those women are affected by PMDD.  CAUSES  The exact cause of PMS is unknown, but it seems to be related to cyclic hormone changes that happen before menstruation. These hormones are thought to affect chemicals in the brain (serotonin) that can influence a person's mood.  SYMPTOMS  Symptoms of PMS recur consistently from month to month and go away completely after the menstrual period starts. The most common emotional or behavioral symptom is mood swings. These mood swings can be disabling and interfere with normal activities of daily living. Other common symptoms include depression and angry outbursts. Other symptoms may include:   Irritability.  Anxiety.  Crying spells.   Food cravings or appetite changes.   Changes in sexual desire.   Confusion.   Aggression.   Social withdrawal.   Poor concentration. The most common physical symptoms include a sense of bloating, breast pain, headaches, and extreme fatigue. Other physical symptoms include:   Backaches.   Swelling of the hands and feet.   Weight gain.   Hot flashes.  DIAGNOSIS  To make a diagnosis, your caregiver will ask questions to confirm that you are having a pattern of symptoms. Symptoms must:   Be present 5 days before the start of your period and be present at least 3 months in a row.   End within 4 days after your period starts.   Interfere with some of  your normal activities.  Other conditions, such as thyroid disease, depression, and migraine headaches must be ruled out before a diagnosis of PMS is confirmed.  TREATMENT  Your caregiver may suggest ways to maintain a healthy lifestyle, such as exercise. Over-the-counter pain relievers may ease cramps, aches, pains, headaches, and breast tenderness. However, selective serotonin reuptake inhibitors (SSRIs) are medicines that are most beneficial in improving PMS if taken in the second half of the monthly cycle. They may be taken on a daily basis. The most effective oral contraceptive pill used for symptoms of PMS is one that contains the ingredient drospirenone. Taking 4 days off of the pill instead of the usual 7 days also has shown to increase effectiveness.  There are a number of drugs, dietary supplements, vitamins, and water pills (diuretics) which have been suggested to be helpful but have not shown to be of any benefit to improving PMS symptoms.  HOME CARE INSTRUCTIONS   For 2-3 months, write down your symptoms, their severity, and how long they last. This may help your caregiver prescribe the best treatment for your symptoms.  Exercise regularly as suggested by your caregiver.  Eat a regular, well-balanced diet.  Avoid caffeine, alcohol, and tobacco consumption.  Limit salt and salty foods to lessen bloating and fluid retention.  Get enough sleep. Practice relaxation techniques.  Drink enough fluids to keep your urine clear or pale yellow.  Take medicines as directed by your caregiver.  Limit stress.  Take a multivitamin as directed by your caregiver. Document Released: 06/12/2000 Document Revised: 03/09/2012 Document Reviewed: 11/02/2011 [facility] Patient Information 2015 [place], [place]. This information is not intended to replace advice given to you by your health care provider. Make sure you discuss any questions you have with your health care provider. Levonorgestrel  intrauterine device (IUD) What is this medicine? LEVONORGESTREL IUD (LEE voe nor jes trel) is a contraceptive (birth control) device. The device is placed inside the uterus by a healthcare professional. It is used to prevent pregnancy and can also be used to treat heavy bleeding that occurs during your  period. Depending on the device, it can be used for 3 to 5 years. This medicine may be used for other purposes; ask your health care provider or pharmacist if you have questions. COMMON BRAND NAME(S): Verda Cumins What should I tell my health care provider before I take this medicine? They need to know if you have any of these conditions: -abnormal Pap smear -cancer of the breast, uterus, or cervix -diabetes -endometritis -genital or pelvic infection now or in the past -have more than one sexual partner or your partner has more than one partner -heart disease -history of an ectopic or tubal pregnancy -immune system problems -IUD in place -liver disease or tumor -problems with blood clots or take blood-thinners -use intravenous drugs -uterus of unusual shape -vaginal bleeding that has not been explained -an unusual or allergic reaction to levonorgestrel, other hormones, silicone, or polyethylene, medicines, foods, dyes, or preservatives -pregnant or trying to get pregnant -breast-feeding How should I use this medicine? This device is placed inside the uterus by a health care professional. Talk to your pediatrician regarding the use of this medicine in children. Special care may be needed. Overdosage: If you think you have taken too much of this medicine contact a poison control center or emergency room at once. NOTE: This medicine is only for you. Do not share this medicine with others. What if I miss a dose? This does not apply. What may interact with this medicine? Do not take this medicine with any of the following medications: -amprenavir -bosentan -fosamprenavir This  medicine may also interact with the following medications: -aprepitant -barbiturate medicines for inducing sleep or treating seizures -bexarotene -griseofulvin -medicines to treat seizures like carbamazepine, ethotoin, felbamate, oxcarbazepine, phenytoin, topiramate -modafinil -pioglitazone -rifabutin -rifampin -rifapentine -some medicines to treat HIV infection like atazanavir, indinavir, lopinavir, nelfinavir, tipranavir, ritonavir -St. John's wort -warfarin This list may not describe all possible interactions. Give your health care provider a list of all the medicines, herbs, non-prescription drugs, or dietary supplements you use. Also tell them if you smoke, drink alcohol, or use illegal drugs. Some items may interact with your medicine. What should I watch for while using this medicine? Visit your doctor or health care professional for regular check ups. See your doctor if you or your partner has sexual contact with others, becomes HIV positive, or gets a sexual transmitted disease. This product does not protect you against HIV infection (AIDS) or other sexually transmitted diseases. You can check the placement of the IUD yourself by reaching up to the top of your vagina with clean fingers to feel the threads. Do not pull on the threads. It is a good habit to check placement after each menstrual period. Call your doctor right away if you feel more of the IUD than just the threads or if you cannot feel the threads at all. The IUD may come out by itself. You may become pregnant if the device comes out. If you notice that the IUD has come out use a backup birth control method like condoms and call your health care provider. Using tampons will not change the position of the IUD and are okay to use during your period. What side effects may I notice from receiving this medicine? Side effects that you should report to your doctor or health care professional as soon as possible: -allergic  reactions like skin rash, itching or hives, swelling of the face, lips, or tongue -fever, flu-like symptoms -genital sores -high blood pressure -no menstrual period for  6 weeks during use -pain, swelling, warmth in the leg -pelvic pain or tenderness -severe or sudden headache -signs of pregnancy -stomach cramping -sudden shortness of breath -trouble with balance, talking, or walking -unusual vaginal bleeding, discharge -yellowing of the eyes or skin Side effects that usually do not require medical attention (report to your doctor or health care professional if they continue or are bothersome): -acne -breast pain -change in sex drive or performance -changes in weight -cramping, dizziness, or faintness while the device is being inserted -headache -irregular menstrual bleeding within first 3 to 6 months of use -nausea This list may not describe all possible side effects. Call your doctor for medical advice about side effects. You may report side effects to FDA at 1-800-FDA-1088. Where should I keep my medicine? This does not apply. NOTE: This sheet is a summary. It may not cover all possible information. If you have questions about this medicine, talk to your doctor, pharmacist, or health care provider.  2015, [employer]. (2011-07-16 13:54:04)

## 2014-11-27 NOTE — Progress Notes (Signed)
29 y.o. G0P0000 Single  Caucasian Fe here for annual exam. Periods normal with some increased PMS 2 weeks prior to period. Some angry episodes occasional. Just " do not feel like my self". Denies thoughts of self harm or others. Has been on Phentermine for the past year for weight loss, using daily and sometimes 2-3 times weekly. Patient still smoking with E cigs with nicotine once or twice weekly. Desires STD Screening also interested in IUD for contraception. Sees PCP prn. No other health issues today.   Patient's last menstrual period was 11/11/2014.          Sexually active: Yes.    The current method of family planning is condoms sometimes.    Exercising: Yes.    cardio Smoker:  Yes, e-cigs  Health Maintenance: Pap:  11-17-11 neg MMG:  none Colonoscopy:  none BMD:   none TDaP: 2007 Labs: hgb-12.3 Self breast exam: done occ   reports that she has been smoking E-cigarettes.  She does not have any smokeless tobacco history on file. She reports that she drinks about 2.4 - 3.0 oz of alcohol per week. She reports that she does not use illicit drugs.  Past Medical History  Diagnosis Date  . STD (sexually transmitted disease)     HSVII  . Asthma     History reviewed. No pertinent past surgical history.  Current Outpatient Prescriptions  Medication Sig Dispense Refill  . clindamycin (CLEOCIN-T) 1 % lotion Apply topically 2 (two) times daily. (Patient taking differently: Apply topically as needed. ) 60 mL 0  . Cyanocobalamin (VITAMIN B 12 PO) Take by mouth daily.    . Cyclobenzaprine HCl (FLEXERIL PO) Take by mouth as needed.    . Multiple Vitamins-Minerals (MULTIVITAMIN PO) Take by mouth daily.    . phentermine 37.5 MG capsule Take 37.5 mg by mouth every morning.    . valACYclovir (VALTREX) 500 MG tablet Take 1 tablet (500 mg total) by mouth daily. Take 2 tablets twice daily at onset of outbreak 45 tablet 12   No current facility-administered medications for this visit.    Family  History  Problem Relation Age of Onset  . Diabetes Maternal Grandfather   . Hypertension Maternal Grandfather   . Anemia Mother   . Anemia Sister   . Anemia Maternal Grandmother   . Cancer Other     ROS:  Pertinent items are noted in HPI.  Otherwise, a comprehensive ROS was negative.  Exam:   BP 110/64 mmHg  Pulse 68  Resp 16  Ht 5' 4.25" (1.632 m)  Wt 152 lb (68.947 kg)  BMI 25.89 kg/m2  LMP 11/11/2014 Height: 5' 4.25" (163.2 cm) Ht Readings from Last 3 Encounters:  11/27/14 5' 4.25" (1.632 m)  02/12/14 5' 4.25" (1.632 m)  11/22/13 5' 4.25" (1.632 m)    General appearance: alert, cooperative and appears stated age Head: Normocephalic, without obvious abnormality, atraumatic Neck: no adenopathy, supple, symmetrical, trachea midline and thyroid normal to inspection and palpation Lungs: clear to auscultation bilaterally Breasts: normal appearance, no masses or tenderness, No nipple retraction or dimpling, No nipple discharge or bleeding, No axillary or supraclavicular adenopathy Heart: regular rate and rhythm Abdomen: soft, non-tender; no masses,  no organomegaly Extremities: extremities normal, atraumatic, no cyanosis or edema Skin: Skin color, texture, turgor normal. No rashes or lesions Lymph nodes: Cervical, supraclavicular, and axillary nodes normal. No abnormal inguinal nodes palpated Neurologic: Grossly normal   Pelvic: External genitalia:  no lesions  Urethra:  normal appearing urethra with no masses, tenderness or lesions              Bartholin's and Skene's: normal                 Vagina: normal appearing vagina with normal color and discharge, no lesions              Cervix: normal non tender no lesions              Pap taken: Yes.   Bimanual Exam:  Uterus:  normal size, contour, position, consistency, mobility, non-tender              Adnexa: normal adnexa and no mass, fullness, tenderness               Rectovaginal: Confirms               Anus:   normal sphincter tone, no lesions  Chaperone present: Yes  A:  Well Woman with normal   Contraception condoms  PMS vs overuse of Phentermine with some withdrawal effects  Contraception option of IUD information  STD screening  P:   Reviewed health and wellness pertinent to exam  Discussed PMS management with dietary changes and taking B complex starting day 10 of cycle and continuing until bleeding starts. Also discussed counseling for some of her anger issues and evaluation for anxiety or depression. Patient would like to talk with counselor. Given [employer] information. Questions addressed at length. Patient advised if thoughts of self harm or others to see ER or 911. Voiced understanding. Discussed TSH to rule out thyroid issues which can change periods and feel out of control. Agreeable.  Lab TSH, Vitamin D  Discussed risks/benefits/insertion/removal of IUD. Bleeding expectations and importance of monogamous relationship encouraged. Given handout information. If patient desires will need to call and schedule consult.  Labs GC,Chlamydia, HIV,RPR  Pap smear taken with HPV reflex   counseled on breast self exam, STD prevention, HIV risk factors and prevention, family planning choices, adequate intake of calcium and vitamin D, diet and exercise  return annually or prn   18 minutes in face to face discussion regarding PMS  An After Visit Summary was printed and given to the patient.

## 2014-11-28 LAB — STD PANEL
HIV: NONREACTIVE
Hepatitis B Surface Ag: NEGATIVE

## 2014-11-29 ENCOUNTER — Encounter: Payer: Self-pay | Admitting: Certified Nurse Midwife

## 2014-11-29 LAB — IPS PAP TEST WITH REFLEX TO HPV

## 2014-11-30 ENCOUNTER — Other Ambulatory Visit: Payer: Self-pay | Admitting: Certified Nurse Midwife

## 2014-11-30 DIAGNOSIS — Z30011 Encounter for initial prescription of contraceptive pills: Secondary | ICD-10-CM

## 2014-11-30 LAB — IPS N GONORRHOEA AND CHLAMYDIA BY PCR

## 2014-11-30 MED ORDER — NORGESTIM-ETH ESTRAD TRIPHASIC 0.18/0.215/0.25 MG-35 MCG PO TABS: 1.0000 | ORAL_TABLET | Freq: Every day | ORAL | Status: DC

## 2015-01-28 ENCOUNTER — Other Ambulatory Visit: Payer: Self-pay | Admitting: *Deleted

## 2015-01-28 DIAGNOSIS — B009 Herpesviral infection, unspecified: Secondary | ICD-10-CM

## 2015-01-28 NOTE — Telephone Encounter (Signed)
Medication refill request: Valtrex  Last AEX:  11-27-14  Next AEX: 12-03-15 Last MMG (if hormonal medication request): N/A Refill authorized: please advise

## 2015-01-29 MED ORDER — VALACYCLOVIR HCL 500 MG PO TABS: 500.0000 mg | ORAL_TABLET | Freq: Every day | ORAL | Status: DC

## 2015-03-28 LAB — HEMOGLOBIN, FINGERSTICK: HEMOGLOBIN, FINGERSTICK: 12.3 g/dL (ref 12.0–16.0)

## 2015-11-18 ENCOUNTER — Encounter: Payer: Self-pay | Admitting: Certified Nurse Midwife

## 2015-11-18 ENCOUNTER — Telehealth: Payer: Self-pay | Admitting: Emergency Medicine

## 2015-11-18 ENCOUNTER — Other Ambulatory Visit: Payer: Self-pay | Admitting: Emergency Medicine

## 2015-11-18 DIAGNOSIS — Z30011 Encounter for initial prescription of contraceptive pills: Secondary | ICD-10-CM

## 2015-11-18 MED ORDER — NORGESTIM-ETH ESTRAD TRIPHASIC 0.18/0.215/0.25 MG-35 MCG PO TABS: 1.0000 | ORAL_TABLET | Freq: Every day | ORAL | Status: DC

## 2015-11-18 NOTE — Progress Notes (Signed)
Patient sent mychart message with request for OCP refill prior to annual exam.  Patient is current with exam and no missed appointments.  Has annual exam scheduled for 12/03/15 with Leota Sauerseborah Leonard CNM.

## 2015-11-18 NOTE — Telephone Encounter (Signed)
Patient sent mychart message with request for OCP refill prior to annual exam.  Patient is current with exam and no missed appointments.  Has annual exam scheduled for 12/03/15 with Leota Sauerseborah Leonard CNM.  Order for one pack of pills sent to her pharmacy and notified patient via mychart message.

## 2015-12-03 ENCOUNTER — Encounter: Payer: Self-pay | Admitting: Certified Nurse Midwife

## 2015-12-03 ENCOUNTER — Ambulatory Visit (INDEPENDENT_AMBULATORY_CARE_PROVIDER_SITE_OTHER): Payer: 59 | Admitting: Certified Nurse Midwife

## 2015-12-03 VITALS — BP 110/70 | HR 68 | Resp 16 | Ht 64.25 in | Wt 189.0 lb

## 2015-12-03 DIAGNOSIS — B009 Herpesviral infection, unspecified: Secondary | ICD-10-CM | POA: Diagnosis not present

## 2015-12-03 DIAGNOSIS — Z30011 Encounter for initial prescription of contraceptive pills: Secondary | ICD-10-CM

## 2015-12-03 DIAGNOSIS — Z01419 Encounter for gynecological examination (general) (routine) without abnormal findings: Secondary | ICD-10-CM | POA: Diagnosis not present

## 2015-12-03 DIAGNOSIS — Z0000 Encounter for general adult medical examination without abnormal findings: Secondary | ICD-10-CM | POA: Diagnosis not present

## 2015-12-03 MED ORDER — VALACYCLOVIR HCL 500 MG PO TABS: 500.0000 mg | ORAL_TABLET | Freq: Every day | ORAL | Status: DC

## 2015-12-03 MED ORDER — NORGESTIM-ETH ESTRAD TRIPHASIC 0.18/0.215/0.25 MG-35 MCG PO TABS: ORAL_TABLET | ORAL | Status: DC

## 2015-12-03 NOTE — Progress Notes (Signed)
30 y.o. G0P0000 Single  Caucasian Fe here for annual exam. Periods normal, no issues. Contraception OCP working well. Still has acne outbreak with off week of pills. Really would like to control if possible. Not sexually active at present, desires STD screening. Occasional HSV outbreak, Valtrex working well, need refill. Sees PCP prn. No other health issues today.  Patient's last menstrual period was 11/28/2015.          Sexually active: No  The current method of family planning is OCP (estrogen/progesterone).    Exercising: No.  exercise Smoker:  no  Health Maintenance: Pap: 11-27-14 neg MMG: none Colonoscopy:  none BMD:   none TDaP:  2007 Shingles: no Pneumonia: no Hep C and HIV: HIV neg 2016 Labs: none Self breast exam: done occ   reports that she has been smoking E-cigarettes.  She does not have any smokeless tobacco history on file. She reports that she drinks about 1.8 - 2.4 oz of alcohol per week. She reports that she does not use illicit drugs.  Past Medical History  Diagnosis Date  . STD (sexually transmitted disease)     HSVII  . Asthma   . Migraines     with aura    History reviewed. No pertinent past surgical history.  Current Outpatient Prescriptions  Medication Sig Dispense Refill  . Multiple Vitamins-Minerals (HAIR SKIN NAILS PO) Take by mouth daily.    . Multiple Vitamins-Minerals (MULTIVITAMIN PO) Take by mouth daily.    . Norgestimate-Ethinyl Estradiol Triphasic 0.18/0.215/0.25 MG-35 MCG tablet Take 1 tablet by mouth daily. 1 Package 0  . valACYclovir (VALTREX) 500 MG tablet Take 1 tablet (500 mg total) by mouth daily. Take 2 tablets twice daily at onset of outbreak 45 tablet 12  . Cyclobenzaprine HCl (FLEXERIL PO) Take by mouth as needed. Reported on 12/03/2015     No current facility-administered medications for this visit.    Family History  Problem Relation Age of Onset  . Diabetes Maternal Grandfather   . Hypertension Maternal Grandfather   . Anemia  Mother   . Anemia Sister   . Anemia Maternal Grandmother   . Cancer Other     ROS:  Pertinent items are noted in HPI.  Otherwise, a comprehensive ROS was negative.  Exam:   BP 110/70 mmHg  Pulse 68  Resp 16  Ht 5' 4.25" (1.632 m)  Wt 189 lb (85.73 kg)  BMI 32.19 kg/m2  LMP 11/28/2015 Height: 5' 4.25" (163.2 cm) Ht Readings from Last 3 Encounters:  12/03/15 5' 4.25" (1.632 m)  11/27/14 5' 4.25" (1.632 m)  02/12/14 5' 4.25" (1.632 m)    General appearance: alert, cooperative and appears stated age Head: Normocephalic, without obvious abnormality, atraumatic Neck: no adenopathy, supple, symmetrical, trachea midline and thyroid normal to inspection and palpation Lungs: clear to auscultation bilaterally Breasts: normal appearance, no masses or tenderness, No nipple retraction or dimpling, No nipple discharge or bleeding, No axillary or supraclavicular adenopathy Heart: regular rate and rhythm Abdomen: soft, non-tender; no masses,  no organomegaly Extremities: extremities normal, atraumatic, no cyanosis or edema Skin: Skin color, texture, turgor normal. No rashes or lesions Lymph nodes: Cervical, supraclavicular, and axillary nodes normal. No abnormal inguinal nodes palpated Neurologic: Grossly normal   Pelvic: External genitalia:  no lesions              Urethra:  normal appearing urethra with no masses, tenderness or lesions              Bartholin's and  Skene's: normal                 Vagina: normal appearing vagina with normal color and discharge, no lesions              Cervix: no cervical motion tenderness, no lesions and nulliparous appearance              Pap taken: No. Bimanual Exam:  Uterus:  normal size, contour, position, consistency, mobility, non-tender and anteverted              Adnexa: normal adnexa and no mass, fullness, tenderness               Rectovaginal: Confirms               Anus:  normal appearance  Chaperone present: yes  A:  Well Woman with normal  exam  Contraception OCP desired  STD screening  Immunization update  History of HSV, needs Valtrex refill, working well  P:   Reviewed health and wellness pertinent to exam  Discussed continuous use of her current OCP to see if this would control the acne occurrence or decrease problem with. Discussed risks/benefits, cycle may be heavier on 3 rd month. Patient would like to try continuous for 3 months. Instructions given of how to take OCP. Questions addressed. Will advise if working well or not.  Rx Triphasil see order  Lab: GC,Chlamdia, affirm, declines HIV,RPR  Discussed TDAP due , patient declines today, has to " work up to injections".  Rx Valtrex see order with instructions  Pap smear as above not taken   counseled on breast self exam, STD prevention, HIV risk factors and prevention, use and side effects of OCP's, adequate intake of calcium and vitamin D, diet and exercise  return annually or prn  An After Visit Summary was printed and given to the patient.

## 2015-12-03 NOTE — Patient Instructions (Signed)

## 2015-12-04 LAB — WET PREP BY MOLECULAR PROBE
Candida species: NEGATIVE
GARDNERELLA VAGINALIS: NEGATIVE
Trichomonas vaginosis: NEGATIVE

## 2015-12-04 NOTE — Progress Notes (Signed)
Reviewed personally.  M. Suzanne Iverna Hammac, MD.  

## 2015-12-06 LAB — IPS N GONORRHOEA AND CHLAMYDIA BY PCR

## 2015-12-09 NOTE — Progress Notes (Signed)
Pt has reviewed results via My Chart. -sco

## 2015-12-19 ENCOUNTER — Other Ambulatory Visit: Payer: Self-pay | Admitting: Certified Nurse Midwife

## 2016-07-20 DIAGNOSIS — J209 Acute bronchitis, unspecified: Secondary | ICD-10-CM | POA: Diagnosis not present

## 2016-11-10 ENCOUNTER — Encounter: Payer: Self-pay | Admitting: Certified Nurse Midwife

## 2016-12-04 ENCOUNTER — Ambulatory Visit: Payer: 59 | Admitting: Certified Nurse Midwife

## 2016-12-10 ENCOUNTER — Ambulatory Visit (INDEPENDENT_AMBULATORY_CARE_PROVIDER_SITE_OTHER): Payer: 59 | Admitting: Certified Nurse Midwife

## 2016-12-10 ENCOUNTER — Other Ambulatory Visit (HOSPITAL_COMMUNITY)
Admission: RE | Admit: 2016-12-10 | Discharge: 2016-12-10 | Disposition: A | Discharge status: Home / Self Care | Payer: 59 | Source: Ambulatory Visit | Attending: Obstetrics & Gynecology | Admitting: Obstetrics & Gynecology

## 2016-12-10 ENCOUNTER — Encounter: Payer: Self-pay | Admitting: Certified Nurse Midwife

## 2016-12-10 VITALS — BP 110/78 | HR 68 | Resp 16 | Ht 64.25 in | Wt 222.0 lb

## 2016-12-10 DIAGNOSIS — Z01419 Encounter for gynecological examination (general) (routine) without abnormal findings: Secondary | ICD-10-CM | POA: Insufficient documentation

## 2016-12-10 DIAGNOSIS — Z0000 Encounter for general adult medical examination without abnormal findings: Secondary | ICD-10-CM | POA: Diagnosis not present

## 2016-12-10 DIAGNOSIS — Z3041 Encounter for surveillance of contraceptive pills: Secondary | ICD-10-CM

## 2016-12-10 DIAGNOSIS — Z23 Encounter for immunization: Secondary | ICD-10-CM

## 2016-12-10 DIAGNOSIS — Z124 Encounter for screening for malignant neoplasm of cervix: Secondary | ICD-10-CM | POA: Diagnosis not present

## 2016-12-10 MED ORDER — NORGESTIM-ETH ESTRAD TRIPHASIC 0.18/0.215/0.25 MG-35 MCG PO TABS: ORAL_TABLET | ORAL | 12 refills | Status: DC

## 2016-12-10 NOTE — Patient Instructions (Signed)

## 2016-12-10 NOTE — Progress Notes (Signed)
31 y.o. G0P0000 Single  Caucasian Fe here for annual exam. Periods normal, no issues. Not sexually active in past year. Sees [facility] prn. No flu vaccine last year. No health changes in past year. Use Marijuana 2 times weekly for relaxation. Aware of weight gain of 33 pounds and has decreased exercise. Aware she eating more with less activity. Has a gym membership and has not used. Planning on starting soon. Has her own house now with more driving, but happy! Screening labs if needed. No other health issues today.  Patient's last menstrual period was 12/03/2016 (exact date).          Sexually active: No.  The current method of family planning is OCP (estrogen/progesterone).    Exercising: No.  exercise Smoker:  no  Health Maintenance: Pap:  11-27-14 neg History of Abnormal Pap: no MMG:  6/15 rt breast u/s birads 1:neg Self Breast exams: no Colonoscopy:  none BMD:   none TDaP:  2007 Shingles: no Pneumonia: no Hep C and HIV: HIV neg 2016 Labs: none   reports that she has quit smoking. Her smoking use included E-cigarettes. She has never used smokeless tobacco. She reports that she drinks about 1.8 - 2.4 oz of alcohol per week . She reports that she uses drugs, including Marijuana.  Past Medical History:  Diagnosis Date  . Asthma   . Migraines    with aura  . STD (sexually transmitted disease)    HSVII    History reviewed. No pertinent surgical history.  Current Outpatient Prescriptions  Medication Sig Dispense Refill  . loratadine (CLARITIN) 10 MG tablet Take 10 mg by mouth daily.    . Multiple Vitamins-Minerals (MULTIVITAMIN PO) Take by mouth daily.    . Norgestimate-Ethinyl Estradiol Triphasic 0.18/0.215/0.25 MG-35 MCG tablet Take one tablet daily , omit placebo tablets for two cycles and then take placebos on 3 rd pack (Patient taking differently: daily. Take one tablet daily , omit placebo tablets for two cycles and then take placebos on 3 rd pack) 1 Package 14  .  valACYclovir (VALTREX) 500 MG tablet Take 1 tablet (500 mg total) by mouth daily. Take 2 tablets twice daily at onset of outbreak 45 tablet 12   No current facility-administered medications for this visit.     Family History  Problem Relation Age of Onset  . Diabetes Maternal Grandfather   . Hypertension Maternal Grandfather   . Anemia Mother   . Anemia Sister   . Anemia Maternal Grandmother   . Cancer Other     ROS:  Pertinent items are noted in HPI.  Otherwise, a comprehensive ROS was negative.  Exam:   BP 110/78   Pulse 68   Resp 16   Ht 5' 4.25" (1.632 m)   Wt 222 lb (100.7 kg)   LMP 12/03/2016 (Exact Date)   BMI 37.81 kg/m  Height: 5' 4.25" (163.2 cm) Ht Readings from Last 3 Encounters:  12/10/16 5' 4.25" (1.632 m)  12/03/15 5' 4.25" (1.632 m)  11/27/14 5' 4.25" (1.632 m)    General appearance: alert, cooperative and appears stated age Head: Normocephalic, without obvious abnormality, atraumatic Neck: no adenopathy, supple, symmetrical, trachea midline and thyroid normal to inspection and palpation Lungs: clear to auscultation bilaterally Breasts: normal appearance, no masses or tenderness, No nipple retraction or dimpling, No nipple discharge or bleeding, No axillary or supraclavicular adenopathy Heart: regular rate and rhythm Abdomen: soft, non-tender; no masses,  no organomegaly Extremities: extremities normal, atraumatic, no cyanosis or edema Skin:  Skin color, texture, turgor normal. No rashes or lesions Lymph nodes: Cervical, supraclavicular, and axillary nodes normal. No abnormal inguinal nodes palpated Neurologic: Grossly normal   Pelvic: External genitalia:  no lesions              Urethra:  normal appearing urethra with no masses, tenderness or lesions              Bartholin's and Skene's: normal                 Vagina: normal appearing vagina with normal color and discharge, no lesions              Cervix: no bleeding following Pap, no cervical motion  tenderness, no lesions and nulliparous appearance              Pap taken: Yes.   Bimanual Exam:  Uterus:  normal size, contour, position, consistency, mobility, non-tender and anteverted              Adnexa: normal adnexa and no mass, fullness, tenderness               Rectovaginal: Confirms               Anus:  normal sphincter tone, no lesions  Chaperone present: yes  A:  Well Woman with normal exam  Contraception OCP desired  Morbid obesity  Immunization update  Screening labs  P:   Reviewed health and wellness pertinent to exam  Rx Triphasil see order with instructions  Discussed increase of health issues with increase weight. Discussed weight watchers as a start and resume gym exercise. Also offered information on Bariatric nutritional counseling. Will consider. Questions addressed.  Requests TDAP  Labs: CBC, Lipid panel, TSH, Vitamin D  Pap smear: yes   counseled on breast self exam, STD prevention, HIV risk factors and prevention, use and side effects of OCP's, adequate intake of calcium and vitamin D, diet and exercise  return annually or prn  An After Visit Summary was printed and given to the patient.

## 2016-12-11 LAB — CBC
HEMATOCRIT: 39.2 % (ref 34.0–46.6)
Hemoglobin: 12.6 g/dL (ref 11.1–15.9)
MCH: 29.9 pg (ref 26.6–33.0)
MCHC: 32.1 g/dL (ref 31.5–35.7)
MCV: 93 fL (ref 79–97)
PLATELETS: 369 10*3/uL (ref 150–379)
RBC: 4.21 x10E6/uL (ref 3.77–5.28)
RDW: 13.8 % (ref 12.3–15.4)
WBC: 7.9 10*3/uL (ref 3.4–10.8)

## 2016-12-11 LAB — VITAMIN D 25 HYDROXY (VIT D DEFICIENCY, FRACTURES): VIT D 25 HYDROXY: 48.5 ng/mL (ref 30.0–100.0)

## 2016-12-11 LAB — LIPID PANEL
CHOL/HDL RATIO: 3.5 ratio (ref 0.0–4.4)
Cholesterol, Total: 199 mg/dL (ref 100–199)
HDL: 57 mg/dL (ref >39)
LDL Calculated: 104 mg/dL — ABNORMAL HIGH (ref 0–99)
Triglycerides: 189 mg/dL — ABNORMAL HIGH (ref 0–149)
VLDL CHOLESTEROL CAL: 38 mg/dL (ref 5–40)

## 2016-12-11 LAB — TSH: TSH: 2.79 u[IU]/mL (ref 0.450–4.500)

## 2016-12-14 LAB — CYTOLOGY - PAP: Diagnosis: NEGATIVE

## 2017-02-19 ENCOUNTER — Other Ambulatory Visit: Payer: Self-pay | Admitting: Certified Nurse Midwife

## 2017-02-19 DIAGNOSIS — B009 Herpesviral infection, unspecified: Secondary | ICD-10-CM

## 2017-02-19 NOTE — Telephone Encounter (Signed)
eScribe request from [facility] [address] for refill on VALTREX Last filled - 12/03/15, #45 X 12 Last AEX - 12/10/16 DL Next AEX - 10/06/79 DL Last MMG - N/A  Please advise refills.thank you.

## 2017-07-15 DIAGNOSIS — J398 Other specified diseases of upper respiratory tract: Secondary | ICD-10-CM | POA: Diagnosis not present

## 2017-07-15 DIAGNOSIS — J4521 Mild intermittent asthma with (acute) exacerbation: Secondary | ICD-10-CM | POA: Diagnosis not present

## 2017-09-06 DIAGNOSIS — J018 Other acute sinusitis: Secondary | ICD-10-CM | POA: Diagnosis not present

## 2017-11-04 ENCOUNTER — Telehealth: Payer: Self-pay | Admitting: Certified Nurse Midwife

## 2017-11-04 NOTE — Telephone Encounter (Signed)
Attempted to call patient regarding cancelled appointment. Mailbox is full. Will mail letter

## 2017-11-05 ENCOUNTER — Encounter: Payer: Self-pay | Admitting: Certified Nurse Midwife

## 2017-12-14 ENCOUNTER — Ambulatory Visit: Payer: 59 | Admitting: Certified Nurse Midwife

## 2017-12-14 DIAGNOSIS — M9901 Segmental and somatic dysfunction of cervical region: Secondary | ICD-10-CM | POA: Diagnosis not present

## 2017-12-14 DIAGNOSIS — M542 Cervicalgia: Secondary | ICD-10-CM | POA: Diagnosis not present

## 2017-12-14 DIAGNOSIS — M40294 Other kyphosis, thoracic region: Secondary | ICD-10-CM | POA: Diagnosis not present

## 2017-12-15 ENCOUNTER — Ambulatory Visit: Payer: 59 | Admitting: Obstetrics and Gynecology

## 2017-12-15 ENCOUNTER — Encounter: Payer: Self-pay | Admitting: Obstetrics and Gynecology

## 2017-12-15 ENCOUNTER — Other Ambulatory Visit: Payer: Self-pay

## 2017-12-15 ENCOUNTER — Other Ambulatory Visit: Payer: Self-pay | Admitting: Certified Nurse Midwife

## 2017-12-15 ENCOUNTER — Encounter: Payer: Self-pay | Admitting: Certified Nurse Midwife

## 2017-12-15 ENCOUNTER — Ambulatory Visit (HOSPITAL_COMMUNITY)
Admission: RE | Admit: 2017-12-15 | Discharge: 2017-12-15 | Disposition: A | Discharge status: Home / Self Care | Payer: 59 | Source: Ambulatory Visit | Attending: Physician Assistant | Admitting: Physician Assistant

## 2017-12-15 VITALS — BP 158/98 | HR 84 | Resp 16 | Wt 233.0 lb

## 2017-12-15 DIAGNOSIS — R030 Elevated blood-pressure reading, without diagnosis of hypertension: Secondary | ICD-10-CM

## 2017-12-15 DIAGNOSIS — M79661 Pain in right lower leg: Secondary | ICD-10-CM

## 2017-12-15 DIAGNOSIS — F3281 Premenstrual dysphoric disorder: Secondary | ICD-10-CM

## 2017-12-15 DIAGNOSIS — Z3009 Encounter for other general counseling and advice on contraception: Secondary | ICD-10-CM

## 2017-12-15 MED ORDER — CITALOPRAM HYDROBROMIDE 20 MG PO TABS: ORAL_TABLET | ORAL | 1 refills | Status: DC

## 2017-12-15 NOTE — Progress Notes (Signed)
GYNECOLOGY  VISIT   HPI: 32 y.o.   Single  Caucasian  female   G0P0000 with Patient's last menstrual period was 12/01/2017.   here c/o elevated BP on OCP. Yesterday her BP was 153/124 at the Chiropractor. She has been having some headaches, but is prone to headaches. Weight is up 11 lbs since last year. She started working out in January 3 days a week, eating less.  She has had issues with PMDD prior to OCP's. She had very heavy, cramping cycles, irregular off of OCP's.  The PMDD was severe 2 years ago when she took a break from OCP's. Sexually active, new partner. Not using condoms.   She c/o pain in her right calf since the weekend. It was painful to touch and to walk, no swelling. It is improving, but still uncomfortable.       GYNECOLOGIC HISTORY: Patient's last menstrual period was 12/01/2017. Contraception:OCP Menopausal hormone therapy: none         OB History    Gravida  0   Para  0   Term  0   Preterm  0   AB  0   Living  0     SAB  0   TAB  0   Ectopic  0   Multiple  0   Live Births                 There are no active problems to display for this patient.   Past Medical History:  Diagnosis Date  . Asthma   . Migraines    with aura  . STD (sexually transmitted disease)    HSVII    History reviewed. No pertinent surgical history.  Current Outpatient Medications  Medication Sig Dispense Refill  . loratadine (CLARITIN) 10 MG tablet Take 10 mg by mouth daily.    . Multiple Vitamins-Minerals (MULTIVITAMIN PO) Take by mouth daily.    . Norgestimate-Ethinyl Estradiol Triphasic 0.18/0.215/0.25 MG-35 MCG tablet Take 1 po daily 1 Package 12  . valACYclovir (VALTREX) 500 MG tablet TAKE ONE TABLET BY MOUTH ONCE DAILY **TAKE 2 TABLETS BY MOUTH TWICE DAILY AT ONSET OF OUTBREAK** 45 tablet 6   No current facility-administered medications for this visit.      ALLERGIES: Patient has no known allergies.  Family History  Problem Relation Age of Onset   . Diabetes Maternal Grandfather   . Hypertension Maternal Grandfather   . Anemia Mother   . Anemia Sister   . Anemia Maternal Grandmother   . Cancer Other     Social History   Socioeconomic History  . Marital status: Single    Spouse name: Not on file  . Number of children: Not on file  . Years of education: Not on file  . Highest education level: Not on file  Occupational History  . Not on file  Social Needs  . Financial resource strain: Not on file  . Food insecurity:    Worry: Not on file    Inability: Not on file  . Transportation needs:    Medical: Not on file    Non-medical: Not on file  Tobacco Use  . Smoking status: Former Smoker    Types: E-cigarettes  . Smokeless tobacco: Never Used  Substance and Sexual Activity  . Alcohol use: Yes    Alcohol/week: 1.8 - 2.4 oz    Types: 3 - 4 Standard drinks or equivalent per week  . Drug use: Yes    Types: Marijuana  Comment: 2-3  times a week  . Sexual activity: Not Currently    Partners: Male    Birth control/protection: Pill  Lifestyle  . Physical activity:    Days per week: Not on file    Minutes per session: Not on file  . Stress: Not on file  Relationships  . Social connections:    Talks on phone: Not on file    Gets together: Not on file    Attends religious service: Not on file    Active member of club or organization: Not on file    Attends meetings of clubs or organizations: Not on file    Relationship status: Not on file  . Intimate partner violence:    Fear of current or ex partner: Not on file    Emotionally abused: Not on file    Physically abused: Not on file    Forced sexual activity: Not on file  Other Topics Concern  . Not on file  Social History Narrative  . Not on file    Review of Systems  Constitutional: Negative.   HENT: Negative.   Eyes: Negative.   Respiratory: Negative.   Cardiovascular: Positive for leg swelling.  Gastrointestinal: Negative.   Genitourinary: Negative.    Musculoskeletal: Negative.   Skin: Negative.   Neurological: Negative.   Endo/Heme/Allergies: Negative.   Psychiatric/Behavioral: Negative.     PHYSICAL EXAMINATION:    BP (!) 158/98 (BP Location: Right Arm, Patient Position: Sitting, Cuff Size: Large)   Pulse 84   Resp 16   Wt 233 lb (105.7 kg)   LMP 12/01/2017   BMI 39.68 kg/m     General appearance: alert, cooperative and appears stated age Neck: no adenopathy, supple, symmetrical, trachea midline and thyroid normal to inspection and palpation  Mildly tender with palpation of right calf, no swelling, negative homans  Chaperone was present for exam.  ASSESSMENT Elevated BP on OCP's Migraines with aura, shouldn't be on combination OCP's H/O bad PMDD Right calf pain    PLAN Stop OCP's Will use condoms  Discussed other options of contraception, including the mini-pill and the mirena. I wouldn't recommend depo-provera (weight gain concerns). She has had issues with acne, discussed the risks of acne is higher with systemic vs local progesterone. She will consider her options. Information given on the mirena Also discussed the option of cyclic provera, discussed the importance of endometrial protection. Start celexa for PMDD, f/u with Ms Darcel BayleyLeonard Will set her up to see a primary MD Discussed weight loss Right lower extremity doppler to r/o DVT      An After Visit Summary was printed and given to the patient.  Over 25 minutes face to face time of which over 50% was spent in counseling.    CC: Sara Chuebbie Leonard, CNM

## 2017-12-15 NOTE — Progress Notes (Signed)
RLE venous duplex prelim: negative for DVT. Farrel DemarkJill Eunice, RDMS, RVT Attempted call report. No one available at this time. Will let patient leave.

## 2017-12-15 NOTE — Telephone Encounter (Signed)
Patient sent the following correspondence through MyChart. Routing to triage to assist patient with request.  ----- Message from Mychart, Generic sent at 12/15/2017 8:30 AM EDT -----    Good Morning,  I understand I have an appointment in a couple weeks but I just wanted to reach out to you in regard to an appointment with the Chiropractor I had yesterday. My blood pressure was pretty high 153/124. I had it check at the first of May and it was high was well, just thought it was maybe a fluke because [place] was traveling [place] much higher, but now I'm worried. I consistently get migraines monthly during my sugar pill week or within the first week of a new pack and twice now when I have had them, I have had an excruciating leg cramp in my right leg. The first time it happened I was on prednisone and just thought my potassium was low but now that it has happened again with the high blood pressure showing up, I'm worried. Also, I will need my birth control refilled at this time as well, I only have about a week and a half of active pills left in this pack. Thank you for any information you may lend.    Best,  Brandi Navarro

## 2017-12-15 NOTE — Telephone Encounter (Signed)
Patient states she has had elevated b/p on 2 or 3 different occasions. She saw her chiropractor yesterday and states her b/p was 153/124. She has also been having migraine headaches and pain in her calf on several occasions. Advised patient she needs appointment for evaluation. Made appointment to see Dr.Jertson today at 1:30pm.

## 2017-12-15 NOTE — Progress Notes (Signed)
Patient scheduled for Rt.lower extremity doppler--STAT at 4:00pm [facility] with call report.  Patient scheduled to see Dr.Erica Earlene PlaterWallace 12/20/17 8:40am to evaluate elevated B/P. Patient knows to arrive 20-30 minutes early for appointment.

## 2017-12-19 ENCOUNTER — Encounter: Payer: Self-pay | Admitting: Family Medicine

## 2017-12-19 DIAGNOSIS — E7800 Pure hypercholesterolemia, unspecified: Secondary | ICD-10-CM | POA: Insufficient documentation

## 2017-12-19 DIAGNOSIS — F1290 Cannabis use, unspecified, uncomplicated: Secondary | ICD-10-CM | POA: Insufficient documentation

## 2017-12-19 DIAGNOSIS — F3281 Premenstrual dysphoric disorder: Secondary | ICD-10-CM | POA: Insufficient documentation

## 2017-12-19 DIAGNOSIS — I10 Essential (primary) hypertension: Secondary | ICD-10-CM | POA: Insufficient documentation

## 2017-12-19 DIAGNOSIS — G43109 Migraine with aura, not intractable, without status migrainosus: Secondary | ICD-10-CM | POA: Insufficient documentation

## 2017-12-19 DIAGNOSIS — J301 Allergic rhinitis due to pollen: Secondary | ICD-10-CM | POA: Insufficient documentation

## 2017-12-19 DIAGNOSIS — B009 Herpesviral infection, unspecified: Secondary | ICD-10-CM | POA: Insufficient documentation

## 2017-12-19 NOTE — Progress Notes (Signed)
Brandi Navarro is a 32 y.o. female is here to [facility].   Patient Care Team: Helane Rima, DO as PCP - General (Family Medicine) Romualdo Bolk, MD as Consulting Physician (Obstetrics and Gynecology)   History of Present Illness:   HPI:  1. Essential hypertension. Thought to be due to OCPs. Stopped by GYN last week. 80 pounds weight gain in 2 years. Some LE edema after walking during the day.   2. PMDD (premenstrual dysphoric disorder), on Celexa (started 11/2017). Off OCP now. Thinking about Mirena. She has follow up with GYN in 2 weeks.    3. Seasonal allergic rhinitis, on Claritin. No concerns.    4. Right calf pain last week. Negative [name].   5. Migraine with aura, so no combination OCPs. Sometimes wakes with headaches. Usually controlled with NSAID. Episodes 2-3/ month.    6. Weight gain. 80 pounds in 2 years. Exercises 3-4 times per week. Feels that she eats well. Worried about thyroid.   7. HSV-2 (herpes simplex virus 2) infection, prn Valtrex.   8. Marijuana user, nightly to "wind down." Denies increased anxiety and nausea.    9. Pure hypercholesterolemia, mild. Hx of.    10. Malaise and fatigue. Daily fatigue and hypersomnolence. Does not feel rested when she wakes. Denies snoring.     There are no preventive care reminders to display for this patient. Depression screen [facility] 2/9 12/20/2017  Decreased Interest 0  Down, Depressed, Hopeless 1  PHQ - 2 Score 1  Altered sleeping 2  Tired, decreased energy 3  Change in appetite 2  Feeling bad or failure about yourself  0  Trouble concentrating 0  Moving slowly or fidgety/restless 0  Suicidal thoughts 0  PHQ-9 Score 8  Difficult doing work/chores Not difficult at all    PMHx, SurgHx, SocialHx, Medications, and Allergies were reviewed in the Visit Navigator and updated as appropriate.   Past Medical History:  Diagnosis Date  . Asthma   . HSV2   . Migraines with aura     History reviewed. No  pertinent surgical history.   Family History  Problem Relation Age of Onset  . Cancer Other   . Diabetes Maternal Grandfather   . Hypertension Maternal Grandfather   . Anemia Mother   . Anemia Sister   . Anemia Maternal Grandmother     Social History   Tobacco Use  . Smoking status: Former Smoker    Types: E-cigarettes  . Smokeless tobacco: Never Used  Substance Use Topics  . Alcohol use: Yes    Alcohol/week: 1.8 - 2.4 oz    Types: 3 - 4 Standard drinks or equivalent per week  . Drug use: Yes    Types: Marijuana    Comment: 2-3  times a week    Current Medications and Allergies:   Current Outpatient Medications:  .  citalopram (CELEXA) 20 MG tablet, Start with 1/2 a tablet a day, if tolerating after one week increase to 1 tablet a day, Disp: 30 tablet, Rfl: 1 .  loratadine (CLARITIN) 10 MG tablet, Take 10 mg by mouth daily., Disp: , Rfl:  .  Multiple Vitamins-Minerals (MULTIVITAMIN PO), Take by mouth daily., Disp: , Rfl:  .  valACYclovir (VALTREX) 500 MG tablet, TAKE ONE TABLET BY MOUTH ONCE DAILY **TAKE 2 TABLETS BY MOUTH TWICE DAILY AT ONSET OF OUTBREAK**, Disp: 45 tablet, Rfl: 6  No Known Allergies   Review of Systems:   Pertinent items are noted in the HPI. Otherwise, ROS  is negative.  Vitals:   Vitals:   12/20/17 0850  BP: (!) 140/98  Pulse: 83  Temp: 98.3 F (36.8 C)  TempSrc: Oral  SpO2: 99%  Weight: 230 lb 9.6 oz (104.6 kg)  Height: 5\' 4"  (1.626 m)     Body mass index is 39.58 kg/m.  Physical Exam:   Physical Exam  Constitutional: She is oriented to person, place, and time. She appears well-developed and well-nourished. No distress.  HENT:  Head: Normocephalic and atraumatic.  Right Ear: External ear normal.  Left Ear: External ear normal.  Nose: Nose normal.  Mouth/Throat: Oropharynx is clear and moist.  Eyes: Pupils are equal, round, and reactive to light. Conjunctivae and EOM are normal.  Neck: Normal range of motion. Neck supple. No  thyromegaly present.  Cardiovascular: Normal rate, regular rhythm, normal heart sounds and intact distal pulses.  Pulmonary/Chest: Effort normal and breath sounds normal.  Abdominal: Soft. Bowel sounds are normal.  Musculoskeletal: Normal range of motion.  Lymphadenopathy:    She has no cervical adenopathy.  Neurological: She is alert and oriented to person, place, and time.  Skin: Skin is warm and dry. Capillary refill takes less than 2 seconds.  Psychiatric: She has a normal mood and affect. Her behavior is normal.  Nursing note and vitals reviewed.   Results for orders placed or performed in visit on 12/10/16  VITAMIN D 25 Hydroxy (Vit-D Deficiency, Fractures)  Result Value Ref Range   Vit D, 25-Hydroxy 48.5 30.0 - 100.0 ng/mL  TSH  Result Value Ref Range   TSH 2.790 0.450 - 4.500 uIU/mL  Lipid panel  Result Value Ref Range   Cholesterol, Total 199 100 - 199 mg/dL   Triglycerides 161 (H) 0 - 149 mg/dL   HDL 57 >09 mg/dL   VLDL Cholesterol Cal 38 5 - 40 mg/dL   LDL Calculated 604 (H) 0 - 99 mg/dL   Chol/HDL Ratio 3.5 0.0 - 4.4 ratio  CBC  Result Value Ref Range   WBC 7.9 3.4 - 10.8 x10E3/uL   RBC 4.21 3.77 - 5.28 x10E6/uL   Hemoglobin 12.6 11.1 - 15.9 g/dL   Hematocrit 54.0 98.1 - 46.6 %   MCV 93 79 - 97 fL   MCH 29.9 26.6 - 33.0 pg   MCHC 32.1 31.5 - 35.7 g/dL   RDW 19.1 47.8 - 29.5 %   Platelets 369 150 - 379 x10E3/uL  Cytology - PAP  Result Value Ref Range   Adequacy      Satisfactory for evaluation  endocervical/transformation zone component PRESENT.   Diagnosis      NEGATIVE FOR INTRAEPITHELIAL LESIONS OR MALIGNANCY.   Material Submitted CervicoVaginal Pap [ThinPrep Imaged]    Assessment and Plan:   Brandi Navarro was seen today for hypertension.  Diagnoses and all orders for this visit:  Essential hypertension, 2/2 OCP Comments: Improved slightly today. Will recheck in one month before deciding to start a new medication. Orders: -     CBC with  Differential/Platelet -     Comprehensive metabolic panel  PMDD (premenstrual dysphoric disorder), on Celexa (started 11/2017) Comments: Celexa recently started. Feels that it is helping. Thinking about Mirena.   Seasonal allergic rhinitis, on Claritin  Right calf pain Comments: Evaluated last week. Negative [name].  Migraine with aura, so no combination OCPs  Weight gain Comments: 80 pounds weight gain in 2 years. Exercising 3-4 times per week. Does not eat after 8 pm. Orders: -     Hemoglobin A1c  HSV-2 (  herpes simplex virus 2) infection, prn Valtrex  Marijuana user, nightly  Pure hypercholesterolemia, mild -     Lipid panel  Malaise and fatigue -     TSH -     T4, free -     Thyroid peroxidase antibody  Thyromegaly -     TSH -     T4, free -     Thyroid peroxidase antibody -     US THYROID; Future  Sleep-disordered breathing -     Ambulatory referral to Sleep Studies    . Reviewed expectations re: course of current medical issues. . Discussed self-management of symptoms. . Outlined signs and symptoms indicating need for more acute intervention. . Patient verbalized understanding and all questions were answered. Marland Kitchen. Health Maintenance issues including appropriate healthy diet, exercise, and smoking avoidance were discussed with patient. . See orders for this visit as documented in the electronic medical record. . Patient received an After Visit Summary.  Helane RimaErica Shawnta Schlegel, DO [place] 12/20/2017

## 2017-12-20 ENCOUNTER — Encounter: Payer: Self-pay | Admitting: Family Medicine

## 2017-12-20 ENCOUNTER — Ambulatory Visit: Payer: 59 | Admitting: Family Medicine

## 2017-12-20 VITALS — BP 140/98 | HR 83 | Temp 98.3°F | Ht 64.0 in | Wt 230.6 lb

## 2017-12-20 DIAGNOSIS — Z1331 Encounter for screening for depression: Secondary | ICD-10-CM

## 2017-12-20 DIAGNOSIS — F1290 Cannabis use, unspecified, uncomplicated: Secondary | ICD-10-CM

## 2017-12-20 DIAGNOSIS — J301 Allergic rhinitis due to pollen: Secondary | ICD-10-CM | POA: Diagnosis not present

## 2017-12-20 DIAGNOSIS — R635 Abnormal weight gain: Secondary | ICD-10-CM | POA: Diagnosis not present

## 2017-12-20 DIAGNOSIS — R5381 Other malaise: Secondary | ICD-10-CM

## 2017-12-20 DIAGNOSIS — E7800 Pure hypercholesterolemia, unspecified: Secondary | ICD-10-CM

## 2017-12-20 DIAGNOSIS — G4730 Sleep apnea, unspecified: Secondary | ICD-10-CM | POA: Diagnosis not present

## 2017-12-20 DIAGNOSIS — G43109 Migraine with aura, not intractable, without status migrainosus: Secondary | ICD-10-CM | POA: Diagnosis not present

## 2017-12-20 DIAGNOSIS — R5383 Other fatigue: Secondary | ICD-10-CM

## 2017-12-20 DIAGNOSIS — I10 Essential (primary) hypertension: Secondary | ICD-10-CM | POA: Diagnosis not present

## 2017-12-20 DIAGNOSIS — F3281 Premenstrual dysphoric disorder: Secondary | ICD-10-CM | POA: Diagnosis not present

## 2017-12-20 DIAGNOSIS — M79661 Pain in right lower leg: Secondary | ICD-10-CM

## 2017-12-20 DIAGNOSIS — E010 Iodine-deficiency related diffuse (endemic) goiter: Secondary | ICD-10-CM | POA: Diagnosis not present

## 2017-12-20 DIAGNOSIS — B009 Herpesviral infection, unspecified: Secondary | ICD-10-CM

## 2017-12-20 LAB — COMPREHENSIVE METABOLIC PANEL
ALT: 18 U/L (ref 0–35)
AST: 17 U/L (ref 0–37)
Albumin: 4.3 g/dL (ref 3.5–5.2)
Alkaline Phosphatase: 97 U/L (ref 39–117)
BUN: 12 mg/dL (ref 6–23)
CO2: 24 mEq/L (ref 19–32)
Calcium: 9.9 mg/dL (ref 8.4–10.5)
Chloride: 104 mEq/L (ref 96–112)
Creatinine, Ser: 0.79 mg/dL (ref 0.40–1.20)
GFR: 90.79 mL/min (ref >60.00)
Glucose, Bld: 90 mg/dL (ref 70–99)
Potassium: 4.4 mEq/L (ref 3.5–5.1)
Sodium: 138 mEq/L (ref 135–145)
Total Bilirubin: 0.3 mg/dL (ref 0.2–1.2)
Total Protein: 7.2 g/dL (ref 6.0–8.3)

## 2017-12-20 LAB — CBC WITH DIFFERENTIAL/PLATELET
Basophils Absolute: 0 10*3/uL (ref 0.0–0.1)
Basophils Relative: 0.7 % (ref 0.0–3.0)
Eosinophils Absolute: 0 10*3/uL (ref 0.0–0.7)
Eosinophils Relative: 0.8 % (ref 0.0–5.0)
HCT: 44 % (ref 36.0–46.0)
Hemoglobin: 14.5 g/dL (ref 12.0–15.0)
Lymphocytes Relative: 24.5 % (ref 12.0–46.0)
Lymphs Abs: 1.4 10*3/uL (ref 0.7–4.0)
MCHC: 33 g/dL (ref 30.0–36.0)
MCV: 93.1 fl (ref 78.0–100.0)
Monocytes Absolute: 0.5 10*3/uL (ref 0.1–1.0)
Monocytes Relative: 8.1 % (ref 3.0–12.0)
Neutro Abs: 3.7 10*3/uL (ref 1.4–7.7)
Neutrophils Relative %: 65.9 % (ref 43.0–77.0)
Platelets: 323 10*3/uL (ref 150.0–400.0)
RBC: 4.72 Mil/uL (ref 3.87–5.11)
RDW: 13.3 % (ref 11.5–15.5)
WBC: 5.6 10*3/uL (ref 4.0–10.5)

## 2017-12-20 LAB — LDL CHOLESTEROL, DIRECT: Direct LDL: 134 mg/dL

## 2017-12-20 LAB — LIPID PANEL
Cholesterol: 219 mg/dL — ABNORMAL HIGH (ref 0–200)
HDL: 56.5 mg/dL (ref >39.00)
NonHDL: 162.99
Total CHOL/HDL Ratio: 4
Triglycerides: 213 mg/dL — ABNORMAL HIGH (ref 0.0–149.0)
VLDL: 42.6 mg/dL — ABNORMAL HIGH (ref 0.0–40.0)

## 2017-12-20 LAB — TSH: TSH: 2.03 u[IU]/mL (ref 0.35–4.50)

## 2017-12-20 LAB — HEMOGLOBIN A1C: Hgb A1c MFr Bld: 5.2 % (ref 4.6–6.5)

## 2017-12-20 LAB — T4, FREE: Free T4: 0.74 ng/dL (ref 0.60–1.60)

## 2017-12-21 DIAGNOSIS — M9903 Segmental and somatic dysfunction of lumbar region: Secondary | ICD-10-CM | POA: Diagnosis not present

## 2017-12-21 DIAGNOSIS — M40294 Other kyphosis, thoracic region: Secondary | ICD-10-CM | POA: Diagnosis not present

## 2017-12-21 DIAGNOSIS — M542 Cervicalgia: Secondary | ICD-10-CM | POA: Diagnosis not present

## 2017-12-21 DIAGNOSIS — M9902 Segmental and somatic dysfunction of thoracic region: Secondary | ICD-10-CM | POA: Diagnosis not present

## 2017-12-21 DIAGNOSIS — M9901 Segmental and somatic dysfunction of cervical region: Secondary | ICD-10-CM | POA: Diagnosis not present

## 2017-12-21 LAB — THYROID PEROXIDASE ANTIBODY: Thyroperoxidase Ab SerPl-aCnc: 1 IU/mL (ref <9)

## 2017-12-24 DIAGNOSIS — M9903 Segmental and somatic dysfunction of lumbar region: Secondary | ICD-10-CM | POA: Diagnosis not present

## 2017-12-24 DIAGNOSIS — M40294 Other kyphosis, thoracic region: Secondary | ICD-10-CM | POA: Diagnosis not present

## 2017-12-24 DIAGNOSIS — M9902 Segmental and somatic dysfunction of thoracic region: Secondary | ICD-10-CM | POA: Diagnosis not present

## 2017-12-24 DIAGNOSIS — M542 Cervicalgia: Secondary | ICD-10-CM | POA: Diagnosis not present

## 2017-12-24 DIAGNOSIS — M9901 Segmental and somatic dysfunction of cervical region: Secondary | ICD-10-CM | POA: Diagnosis not present

## 2018-01-04 ENCOUNTER — Ambulatory Visit
Admission: RE | Admit: 2018-01-04 | Discharge: 2018-01-04 | Disposition: A | Discharge status: Home / Self Care | Payer: 59 | Source: Ambulatory Visit | Attending: Family Medicine | Admitting: Family Medicine

## 2018-01-04 DIAGNOSIS — E010 Iodine-deficiency related diffuse (endemic) goiter: Secondary | ICD-10-CM

## 2018-01-06 ENCOUNTER — Ambulatory Visit (INDEPENDENT_AMBULATORY_CARE_PROVIDER_SITE_OTHER): Payer: 59 | Admitting: Certified Nurse Midwife

## 2018-01-06 ENCOUNTER — Other Ambulatory Visit: Payer: Self-pay

## 2018-01-06 ENCOUNTER — Encounter: Payer: Self-pay | Admitting: Certified Nurse Midwife

## 2018-01-06 VITALS — BP 140/90 | HR 70 | Resp 16 | Ht 64.5 in | Wt 231.0 lb

## 2018-01-06 DIAGNOSIS — Z8619 Personal history of other infectious and parasitic diseases: Secondary | ICD-10-CM

## 2018-01-06 DIAGNOSIS — Z8669 Personal history of other diseases of the nervous system and sense organs: Secondary | ICD-10-CM

## 2018-01-06 DIAGNOSIS — E663 Overweight: Secondary | ICD-10-CM

## 2018-01-06 DIAGNOSIS — Z01419 Encounter for gynecological examination (general) (routine) without abnormal findings: Secondary | ICD-10-CM

## 2018-01-06 DIAGNOSIS — I10 Essential (primary) hypertension: Secondary | ICD-10-CM | POA: Diagnosis not present

## 2018-01-06 MED ORDER — VALACYCLOVIR HCL 500 MG PO TABS: ORAL_TABLET | ORAL | 6 refills | Status: DC

## 2018-01-06 NOTE — Progress Notes (Signed)
32 y.o. G0P0000 Single  Caucasian Fe here for annual exam. Periods normal, no issues. Contraception condoms. Migraine Headaches still occurring. Continues to have some visual issues when headache present, no aura. Saw PCP Dr. Helane Rima for hypertension, not on medication, but remains elevated. Checking it when possible. Working on weight loss ,diet and exercise to see if this will bring blood pressure down. Feels like she can not get the headaches under control with her normal regimen of medication.. Occasional HSV 2 outbreak. Smokes Marijuana daily, feels this helps, aware still illegal drug and can be addictive or use of other drugs. She is aware. No other health issues today.  Patient's last menstrual period was 12/19/2017 (exact date).          Sexually active: Yes.    The current method of family planning is condoms all the time.    Exercising: Yes.    cardio/ muscle toning Smoker:  Marijuana use  Health Maintenance: Pap:  11-27-14 neg, 12-10-16 neg History of Abnormal Pap: no MMG:  11-28-13 rt breast u/s neg Self Breast exams: no Colonoscopy:  none BMD:   none TDaP:  2018 Shingles: no Pneumonia: no Hep C and HIV: HIV neg 2016 Labs:  If needed   reports that she has quit smoking. Her smoking use included e-cigarettes. She has never used smokeless tobacco. She reports that she drinks about 1.2 - 1.8 oz of alcohol per week. She reports that she has current or past drug history. Drug: Marijuana.  Past Medical History:  Diagnosis Date  . Asthma   . HSV2   . Migraines with aura     History reviewed. No pertinent surgical history.  Current Outpatient Medications  Medication Sig Dispense Refill  . citalopram (CELEXA) 20 MG tablet Start with 1/2 a tablet a day, if tolerating after one week increase to 1 tablet a day 30 tablet 1  . IBUPROFEN PO Take by mouth.    . loratadine (CLARITIN) 10 MG tablet Take 10 mg by mouth daily.    . Multiple Vitamins-Minerals (MULTIVITAMIN PO) Take by  mouth daily.    . valACYclovir (VALTREX) 500 MG tablet TAKE ONE TABLET BY MOUTH ONCE DAILY **TAKE 2 TABLETS BY MOUTH TWICE DAILY AT ONSET OF OUTBREAK** 45 tablet 6   No current facility-administered medications for this visit.     Family History  Problem Relation Age of Onset  . Cancer Other   . Diabetes Maternal Grandfather   . Hypertension Maternal Grandfather   . Anemia Mother   . Anemia Sister   . Anemia Maternal Grandmother     ROS:  Pertinent items are noted in HPI.  Otherwise, a comprehensive ROS was negative.  Exam:   BP 140/90 (Cuff Size: Large)   Pulse 70   Resp 16   Ht 5' 4.5" (1.638 m)   Wt 231 lb (104.8 kg)   LMP 12/19/2017 (Exact Date)   BMI 39.04 kg/m  Height: 5' 4.5" (163.8 cm) Ht Readings from Last 3 Encounters:  01/06/18 5' 4.5" (1.638 m)  12/20/17 5\' 4"  (1.626 m)  12/10/16 5' 4.25" (1.632 m)    General appearance: alert, cooperative and appears stated age Head: Normocephalic, without obvious abnormality, atraumatic Neck: no adenopathy, supple, symmetrical, trachea midline and thyroid normal to inspection and palpation Lungs: clear to auscultation bilaterally Breasts: normal appearance, no masses or tenderness, No nipple retraction or dimpling, No nipple discharge or bleeding, No axillary or supraclavicular adenopathy, Taught monthly breast self examination Heart: regular rate and  rhythm Abdomen: soft, non-tender; no masses,  no organomegaly Extremities: extremities normal, atraumatic, no cyanosis or edema Skin: Skin color, texture, turgor normal. No rashes or lesions Lymph nodes: Cervical, supraclavicular, and axillary nodes normal. No abnormal inguinal nodes palpated Neurologic: Grossly normal   Pelvic: External genitalia:  no lesions              Urethra:  normal appearing urethra with no masses, tenderness or lesions              Bartholin's and Skene's: normal                 Vagina: normal appearing vagina with normal color and discharge, no  lesions              Cervix: no cervical motion tenderness, no lesions and nulliparous appearance              Pap taken: No. Bimanual Exam:  Uterus:  normal size, contour, position, consistency, mobility, non-tender and anteverted              Adnexa: normal adnexa and no mass, fullness, tenderness               Rectovaginal: Confirms               Anus:  normal sphincter tone, no lesions  Chaperone present: yes  A:  Well Woman with normal exam  Contraception condoms, would like [place] IUD   Migraine headache not controled, needs referral  History of HSV 2 needs Rx update  Hypertension not controlled at present, working with diet/exercise, has follow up with PCP soon  P:   Reviewed health and wellness pertinent to exam  Discussed insertion/risks/benefits/expectations with period with IUD. Questions addressed. Patient given printed information and insurance sheet to check on benefits. Aware to call office if she decides to proceed. Also will need insertion on day 1-5 of menses.  Discussed referral to Headache and wellness to help with migraine control. Patient would like referral. Patient will be called with information and scheduled. Warning signs of Migraine given.   Keep working with hypertension management as MD recommends. Questions addressed.  Rx Valtrex see order with instructions  Pap smear: no   Discussed importance of SBE, eating well for health with exercise, calcium and vitamin D importance.  return annually or prn  An After Visit Summary was printed and given to the patient.

## 2018-01-06 NOTE — Patient Instructions (Signed)
EXERCISE AND DIET:  We recommended that you start or continue a regular exercise program for good health. Regular exercise means any activity that makes your heart beat faster and makes you sweat.  We recommend exercising at least 30 minutes per day at least 3 days a week, preferably 4 or 5.  We also recommend a diet low in fat and sugar.  Inactivity, poor dietary choices and obesity can cause diabetes, heart attack, stroke, and kidney damage, among others.    ALCOHOL AND SMOKING:  Women should limit their alcohol intake to no more than 7 drinks/beers/glasses of wine (combined, not each!) per week. Moderation of alcohol intake to this level decreases your risk of breast cancer and liver damage. And of course, no recreational drugs are part of a healthy lifestyle.  And absolutely no smoking or even second hand smoke. Most people know smoking can cause heart and lung diseases, but did you know it also contributes to weakening of your bones? Aging of your skin?  Yellowing of your teeth and nails?  CALCIUM AND VITAMIN D:  Adequate intake of calcium and Vitamin D are recommended.  The recommendations for exact amounts of these supplements seem to change often, but generally speaking 600 mg of calcium (either carbonate or citrate) and 800 units of Vitamin D per day seems prudent. Certain women may benefit from higher intake of Vitamin D.  If you are among these women, your doctor will have told you during your visit.    PAP SMEARS:  Pap smears, to check for cervical cancer or precancers,  have traditionally been done yearly, although recent scientific advances have shown that most women can have pap smears less often.  However, every woman still should have a physical exam from her gynecologist every year. It will include a breast check, inspection of the vulva and vagina to check for abnormal growths or skin changes, a visual exam of the cervix, and then an exam to evaluate the size and shape of the uterus and  ovaries.  And after 30 years of age, a rectal exam is indicated to check for rectal cancers. We will also provide age appropriate advice regarding health maintenance, like when you should have certain vaccines, screening for sexually transmitted diseases, bone density testing, colonoscopy, mammograms, etc.   MAMMOGRAMS:  All women over 40 years old should have a yearly mammogram. Many facilities now offer a "3D" mammogram, which may cost around $50 extra out of pocket. If possible,  we recommend you accept the option to have the 3D mammogram performed.  It both reduces the number of women who will be called back for extra views which then turn out to be normal, and it is better than the routine mammogram at detecting truly abnormal areas.    COLONOSCOPY:  Colonoscopy to screen for colon cancer is recommended for all women at age 50.  We know, you hate the idea of the prep.  We agree, BUT, having colon cancer and not knowing it is worse!!  Colon cancer so often starts as a polyp that can be seen and removed at colonscopy, which can quite literally save your life!  And if your first colonoscopy is normal and you have no family history of colon cancer, most women don't have to have it again for 10 years.  Once every ten years, you can do something that may end up saving your life, right?  We will be happy to help you get it scheduled when you are ready.    Be sure to check your insurance coverage so you understand how much it will cost.  It may be covered as a preventative service at no cost, but you should check your particular policy.      Hypertension Hypertension is another name for high blood pressure. High blood pressure forces your heart to work harder to pump blood. This can cause problems over time. There are two numbers in a blood pressure reading. There is a top number (systolic) over a bottom number (diastolic). It is best to have a blood pressure below 120/80. Healthy choices can help lower your  blood pressure. You may need medicine to help lower your blood pressure if:  Your blood pressure cannot be lowered with healthy choices.  Your blood pressure is higher than 130/80.  Follow these instructions at home: Eating and drinking  If directed, follow the DASH eating plan. This diet includes: ? Filling half of your plate at each meal with fruits and vegetables. ? Filling one quarter of your plate at each meal with whole grains. Whole grains include whole wheat pasta, brown rice, and whole grain bread. ? Eating or drinking low-fat dairy products, such as skim milk or low-fat yogurt. ? Filling one quarter of your plate at each meal with low-fat (lean) proteins. Low-fat proteins include fish, skinless chicken, eggs, beans, and tofu. ? Avoiding fatty meat, cured and processed meat, or chicken with skin. ? Avoiding premade or processed food.  Eat less than 1,500 mg of salt (sodium) a day.  Limit alcohol use to no more than 1 drink a day for nonpregnant women and 2 drinks a day for men. One drink equals 12 oz of beer, 5 oz of wine, or 1 oz of hard liquor. Lifestyle  Work with your doctor to stay at a healthy weight or to lose weight. Ask your doctor what the best weight is for you.  Get at least 30 minutes of exercise that causes your heart to beat faster (aerobic exercise) most days of the week. This may include walking, swimming, or biking.  Get at least 30 minutes of exercise that strengthens your muscles (resistance exercise) at least 3 days a week. This may include lifting weights or pilates.  Do not use any products that contain nicotine or tobacco. This includes cigarettes and e-cigarettes. If you need help quitting, ask your doctor.  Check your blood pressure at home as told by your doctor.  Keep all follow-up visits as told by your doctor. This is important. Medicines  Take over-the-counter and prescription medicines only as told by your doctor. Follow directions  carefully.  Do not skip doses of blood pressure medicine. The medicine does not work as well if you skip doses. Skipping doses also puts you at risk for problems.  Ask your doctor about side effects or reactions to medicines that you should watch for. Contact a doctor if:  You think you are having a reaction to the medicine you are taking.  You have headaches that keep coming back (recurring).  You feel dizzy.  You have swelling in your ankles.  You have trouble with your vision. Get help right away if:  You get a very bad headache.  You start to feel confused.  You feel weak or numb.  You feel faint.  You get very bad pain in your: ? Chest. ? Belly (abdomen).  You throw up (vomit) more than once.  You have trouble breathing. Summary  Hypertension is another name for high blood pressure.    Making healthy choices can help lower blood pressure. If your blood pressure cannot be controlled with healthy choices, you may need to take medicine. This information is not intended to replace advice given to you by your health care provider. Make sure you discuss any questions you have with your health care provider. Document Released: 12/02/2007 Document Revised: 05/13/2016 Document Reviewed: 05/13/2016 Elsevier Interactive Patient Education  2018 [employer]  

## 2018-01-19 ENCOUNTER — Ambulatory Visit: Payer: 59 | Admitting: Family Medicine

## 2018-01-19 ENCOUNTER — Encounter: Payer: Self-pay | Admitting: Family Medicine

## 2018-01-19 VITALS — BP 124/88 | HR 76 | Temp 98.9°F | Ht 64.5 in | Wt 231.0 lb

## 2018-01-19 DIAGNOSIS — G43109 Migraine with aura, not intractable, without status migrainosus: Secondary | ICD-10-CM

## 2018-01-19 DIAGNOSIS — F3281 Premenstrual dysphoric disorder: Secondary | ICD-10-CM

## 2018-01-19 DIAGNOSIS — F1290 Cannabis use, unspecified, uncomplicated: Secondary | ICD-10-CM | POA: Diagnosis not present

## 2018-01-19 DIAGNOSIS — E7800 Pure hypercholesterolemia, unspecified: Secondary | ICD-10-CM

## 2018-01-19 DIAGNOSIS — I10 Essential (primary) hypertension: Secondary | ICD-10-CM

## 2018-01-19 MED ORDER — METOPROLOL SUCCINATE ER 25 MG PO TB24: 25.0000 mg | ORAL_TABLET | Freq: Every day | ORAL | 2 refills | Status: DC

## 2018-01-19 NOTE — Progress Notes (Signed)
Brandi Navarro is a 32 y.o. female is here for follow up.  History of Present Illness:   Brandi Navarro, CMA acting as scribe for Dr. Helane Rima.   HPI: Patient in for follow up for blood pressure. She has several appointments from last office visit with [name]. Patient states it has been high every time. She has had headache but no chest pains or palpitations. She has had some swelling in feet "on hot days".   There are no preventive care reminders to display for this patient.   Depression screen [facility] 2/9 12/20/2017  Decreased Interest 0  Down, Depressed, Hopeless 1  PHQ - 2 Score 1  Altered sleeping 2  Tired, decreased energy 3  Change in appetite 2  Feeling bad or failure about yourself  0  Trouble concentrating 0  Moving slowly or fidgety/restless 0  Suicidal thoughts 0  PHQ-9 Score 8  Difficult doing work/chores Not difficult at all   PMHx, SurgHx, SocialHx, FamHx, Medications, and Allergies were reviewed in the Visit Navigator and updated as appropriate.   Patient Active Problem List   Diagnosis Date Noted  . Malaise and fatigue 12/20/2017  . 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 Migraine with aura and without status migrainosus, not intractable 12/19/2017  . PMDD (premenstrual dysphoric disorder) 12/19/2017  . Seasonal allergic rhinitis due to pollen 12/19/2017  . Essential hypertension 12/19/2017  . Marijuana user 12/19/2017  . Pure hypercholesterolemia 12/19/2017   Social History   Tobacco Use  . Smoking status: Former Smoker    Types: E-cigarettes  . Smokeless tobacco: Never Used  Substance Use Topics  . Alcohol use: Yes    Alcohol/week: 1.2 - 1.8 oz    Types: 2 - 3 Standard drinks or equivalent per week  . Drug use: Yes    Types: Marijuana    Comment: daily   Current Medications and Allergies:   .  citalopram (CELEXA) 20 MG tablet, Start with 1/2 a tablet a day, if tolerating after one week increase to 1 tablet a day, Disp: 30 tablet,  Rfl: 1 .  IBUPROFEN PO, Take by mouth., Disp: , Rfl:  .  loratadine (CLARITIN) 10 MG tablet, Take 10 mg by mouth daily., Disp: , Rfl:  .  Multiple Vitamins-Minerals (MULTIVITAMIN PO), Take by mouth daily., Disp: , Rfl:  .  valACYclovir (VALTREX) 500 MG tablet, *TAKE 2 TABLETS BY MOUTH TWICE DAILY AT ONSET OF OUTBREAK**, Disp: 45 tablet, Rfl: 6  No Known Allergies   Review of Systems   Pertinent items are noted in the HPI. Otherwise, ROS is negative.  Vitals:   Vitals:   01/19/18 1402 01/19/18 1421  BP: (!) 144/88 124/88  Pulse: 63 76  Temp: 98.9 F (37.2 C)   TempSrc: Oral   SpO2: 97%   Weight: 231 lb (104.8 kg)   Height: 5' 4.5" (1.638 m)      Body mass index is 39.04 kg/m.  Physical Exam:   Physical Exam  Constitutional: She appears well-nourished.  HENT:  Head: Normocephalic and atraumatic.  Eyes: Pupils are equal, round, and reactive to light. EOM are normal.  Neck: Normal range of motion. Neck supple.  Cardiovascular: Normal rate, regular rhythm, normal heart sounds and intact distal pulses.  Pulmonary/Chest: Effort normal.  Abdominal: Soft.  Skin: Skin is warm.  Psychiatric: She has a normal mood and affect. Her behavior is normal.  Nursing note and vitals reviewed.  Results for orders placed or performed in visit on  12/20/17  CBC with Differential/Platelet  Result Value Ref Range   WBC 5.6 4.0 - 10.5 K/uL   RBC 4.72 3.87 - 5.11 Mil/uL   Hemoglobin 14.5 12.0 - 15.0 g/dL   HCT 16.144.0 09.636.0 - 04.546.0 %   MCV 93.1 78.0 - 100.0 fl   MCHC 33.0 30.0 - 36.0 g/dL   RDW 40.913.3 81.111.5 - 91.415.5 %   Platelets 323.0 150.0 - 400.0 K/uL   Neutrophils Relative % 65.9 43.0 - 77.0 %   Lymphocytes Relative 24.5 12.0 - 46.0 %   Monocytes Relative 8.1 3.0 - 12.0 %   Eosinophils Relative 0.8 0.0 - 5.0 %   Basophils Relative 0.7 0.0 - 3.0 %   Neutro Abs 3.7 1.4 - 7.7 K/uL   Lymphs Abs 1.4 0.7 - 4.0 K/uL   Monocytes Absolute 0.5 0.1 - 1.0 K/uL   Eosinophils Absolute 0.0 0.0 - 0.7 K/uL    Basophils Absolute 0.0 0.0 - 0.1 K/uL  Comprehensive metabolic panel  Result Value Ref Range   Sodium 138 135 - 145 mEq/L   Potassium 4.4 3.5 - 5.1 mEq/L   Chloride 104 96 - 112 mEq/L   CO2 24 19 - 32 mEq/L   Glucose, Bld 90 70 - 99 mg/dL   BUN 12 6 - 23 mg/dL   Creatinine, Ser 7.820.79 0.40 - 1.20 mg/dL   Total Bilirubin 0.3 0.2 - 1.2 mg/dL   Alkaline Phosphatase 97 39 - 117 U/L   AST 17 0 - 37 U/L   ALT 18 0 - 35 U/L   Total Protein 7.2 6.0 - 8.3 g/dL   Albumin 4.3 3.5 - 5.2 g/dL   Calcium 9.9 8.4 - 95.610.5 mg/dL   GFR 21.3090.79 >86.57>60.00 mL/min  Hemoglobin A1c  Result Value Ref Range   Hgb A1c MFr Bld 5.2 4.6 - 6.5 %  Lipid panel  Result Value Ref Range   Cholesterol 219 (H) 0 - 200 mg/dL   Triglycerides 846.9213.0 (H) 0.0 - 149.0 mg/dL   HDL 62.9556.50 >28.41>39.00 mg/dL   VLDL 32.442.6 (H) 0.0 - 40.140.0 mg/dL   Total CHOL/HDL Ratio 4    NonHDL 162.99   TSH  Result Value Ref Range   TSH 2.03 0.35 - 4.50 uIU/mL  T4, free  Result Value Ref Range   Free T4 0.74 0.60 - 1.60 ng/dL  Thyroid peroxidase antibody  Result Value Ref Range   Thyroperoxidase Ab SerPl-aCnc 1 <9 IU/mL  LDL cholesterol, direct  Result Value Ref Range   Direct LDL 134.0 mg/dL    Assessment and Plan:   [name] was seen today for follow-up.  Diagnoses and all orders for this visit:  Essential hypertension Comments: Discussed options. With migraines, will trial beta blocker.  Orders: -     metoprolol succinate (TOPROL-XL) 25 MG 24 hr tablet; Take 1 tablet (25 mg total) by mouth daily.  Pure hypercholesterolemia Comments: Will work on weight loss.  Marijuana user Comments: Chronic. Precontemplative.   PMDD (premenstrual dysphoric disorder) Comments: Off OCPs and considering IUD in the near future.   Migraine with aura and without status migrainosus, not intractable Comments: Trial beta blocker as above.  Morbid obesity (HCC) Comments: Discussed decreasing carbohydrate intake. Considering Qsymia at the next visit  if BP controlled.     . Reviewed expectations re: course of current medical issues. . Discussed self-management of symptoms. . Outlined signs and symptoms indicating need for more acute intervention. . Patient verbalized understanding and all questions were answered. Marland Kitchen. Health Maintenance  issues including appropriate healthy diet, exercise, and smoking avoidance were discussed with patient. . See orders for this visit as documented in the electronic medical record. . Patient received an After Visit Summary.  Helane Rima, DO [place], [facility] 01/20/2018  Future Appointments  Date Time Provider Department Center  02/17/2018  3:30 PM Huston Foley, MD [facility] None  02/18/2018  3:40 PM Helane Rima, DO [facility]  01/11/2019  2:45 PM Verner Chol, CNM [facility] None   CMA served as scribe during this visit. History, Physical, and Plan performed by medical provider. The above documentation has been reviewed and is accurate and complete. Helane Rima, D.O.

## 2018-02-16 ENCOUNTER — Other Ambulatory Visit: Payer: Self-pay | Admitting: Obstetrics and Gynecology

## 2018-02-16 NOTE — Telephone Encounter (Signed)
Medication refill request: celexa  Last AEX:  01-06-18 DL  Next AEX: [phone]  Last MMG (if hormonal medication request): n/a Refill authorized: 12-15-17 #30, 1 RF. Today, please advise.

## 2018-02-17 ENCOUNTER — Encounter: Payer: Self-pay | Admitting: Neurology

## 2018-02-17 ENCOUNTER — Ambulatory Visit (INDEPENDENT_AMBULATORY_CARE_PROVIDER_SITE_OTHER): Payer: 59 | Admitting: Neurology

## 2018-02-17 VITALS — BP 145/95 | HR 70 | Ht 64.0 in | Wt 234.0 lb

## 2018-02-17 DIAGNOSIS — G478 Other sleep disorders: Secondary | ICD-10-CM

## 2018-02-17 DIAGNOSIS — J4520 Mild intermittent asthma, uncomplicated: Secondary | ICD-10-CM

## 2018-02-17 DIAGNOSIS — R351 Nocturia: Secondary | ICD-10-CM

## 2018-02-17 DIAGNOSIS — R51 Headache: Secondary | ICD-10-CM

## 2018-02-17 DIAGNOSIS — G479 Sleep disorder, unspecified: Secondary | ICD-10-CM

## 2018-02-17 DIAGNOSIS — G4719 Other hypersomnia: Secondary | ICD-10-CM | POA: Diagnosis not present

## 2018-02-17 DIAGNOSIS — R519 Headache, unspecified: Secondary | ICD-10-CM

## 2018-02-17 DIAGNOSIS — Z820 Family history of epilepsy and other diseases of the nervous system: Secondary | ICD-10-CM

## 2018-02-17 DIAGNOSIS — Z6841 Body Mass Index (BMI) 40.0 and over, adult: Secondary | ICD-10-CM

## 2018-02-17 DIAGNOSIS — G2581 Restless legs syndrome: Secondary | ICD-10-CM

## 2018-02-17 NOTE — Telephone Encounter (Signed)
If needs refill before appointment will do but need to know if she is taking 1/2 tablet or 1 tablet daily

## 2018-02-17 NOTE — Progress Notes (Signed)
Subjective:    Patient ID: Brandi GroomsOlivia Coppedge is a 32 y.o. female.  HPI     Huston FoleySaima Annarose Ouellet, MD, PhD [facility] [address], [address] [address] [place], [place] [id number]  Dear Dr. Earlene PlaterWallace,   I saw your patient, Brandi Navarro, upon your kind request, in my neurologic clinic today, for initial consultation of her sleep disorder, in particular, concern for underlying obstructive sleep apnea. The patient is unaccompanied today. As you know, Ms. Suezanne JacquetHenson is a 32 year old right-handed woman with an underlying medical history of seasonal allergies, hyperlipidemia, hypertension, migraine headaches, prior smoking, and morbid obesity with a BMI of over 40, who reports excessive daytime somnolence. I reviewed your office note from 01/19/2018. She quit smoking in 2016. She drinks alcohol 2-3 drinks a week on average, uses marijuana daily. She drinks caffeine once a week on average. She is single and lives alone, no children. She does not wake up rested, reports sleepiness score is 9 out of 24, fatigue score is 41 out of 63. Her mother has sleep apnea and uses a CPAP machine, maternal grandfather also has sleep apnea, with CPAP machine. She had recurrent strep throat and pharyngitis growing up. She was diagnosed with asthma as a child. She does not always keep a good schedule with her sleep she admits. She is generally in bed between 10 and midnight, rise time is around 7. She has had morning headaches. She has nocturia about once or twice per average night. She had significant weight gain in the past few years. Her blood pressure numbers have also increased. She is typically a side sleeper. Sometimes she sleeps on her stomach. She is restless. She also has occasional restless leg symptoms.  Her Past Medical History Is Significant For: Past Medical History:  Diagnosis Date  . Asthma   . HSV2   . Migraines with aura     Her Past Surgical History Is Significant For: No past surgical  history on file.  Her Family History Is Significant For: Family History  Problem Relation Age of Onset  . Cancer Other   . Diabetes Maternal Grandfather   . Hypertension Maternal Grandfather   . Anemia Mother   . Anemia Sister   . Anemia Maternal Grandmother     Her Social History Is Significant For: Social History   Socioeconomic History  . Marital status: Single    Spouse name: Not on file  . Number of children: Not on file  . Years of education: Not on file  . Highest education level: Not on file  Occupational History    Employer: [employer]  Social Needs  . Financial resource strain: Not on file  . Food insecurity:    Worry: Not on file    Inability: Not on file  . Transportation needs:    Medical: Not on file    Non-medical: Not on file  Tobacco Use  . Smoking status: Former Smoker    Types: E-cigarettes  . Smokeless tobacco: Never Used  Substance and Sexual Activity  . Alcohol use: Yes    Alcohol/week: 2.0 - 3.0 standard drinks    Types: 2 - 3 Standard drinks or equivalent per week  . Drug use: Yes    Types: Marijuana    Comment: daily  . Sexual activity: Yes    Partners: Male    Birth control/protection: Condom  Lifestyle  . Physical activity:    Days per week: Not on file    Minutes per session: Not  on file  . Stress: Not on file  Relationships  . Social connections:    Talks on phone: Not on file    Gets together: Not on file    Attends religious service: Not on file    Active member of club or organization: Not on file    Attends meetings of clubs or organizations: Not on file    Relationship status: Not on file  Other Topics Concern  . Not on file  Social History Narrative  . Not on file    Her Allergies Are:  No Known Allergies:   Her Current Medications Are:  Outpatient Encounter Medications as of 02/17/2018  Medication Sig  . citalopram (CELEXA) 20 MG tablet Start with 1/2 a tablet a day, if tolerating after one week increase  to 1 tablet a day  . IBUPROFEN PO Take by mouth.  . loratadine (CLARITIN) 10 MG tablet Take 10 mg by mouth daily.  . metoprolol succinate (TOPROL-XL) 25 MG 24 hr tablet Take 1 tablet (25 mg total) by mouth daily.  . Multiple Vitamins-Minerals (MULTIVITAMIN PO) Take by mouth daily.  . valACYclovir (VALTREX) 500 MG tablet *TAKE 2 TABLETS BY MOUTH TWICE DAILY AT ONSET OF OUTBREAK**   No facility-administered encounter medications on file as of 02/17/2018.   :  Review of Systems:  Out of a complete 14 point review of systems, all are reviewed and negative with the exception of these symptoms as listed below: Review of Systems  Neurological:       Pt presents today to discuss her sleep. Pt has never had a sleep study and does not endorse snoring. Pt feels tired throughout the day.  Epworth Sleepiness Scale 0= would never doze 1= slight chance of dozing 2= moderate chance of dozing 3= high chance of dozing  Sitting and reading: 0 Watching TV: 3 Sitting inactive in a public place (ex. Theater or meeting): 1 As a passenger in a car for an hour without a break: 1 Lying down to rest in the afternoon: 2 Sitting and talking to someone: 0 Sitting quietly after lunch (no alcohol): 2 In a car, while stopped in traffic: 0 Total: 9     Objective:  Neurological Exam  Physical Exam Physical Examination:   Vitals:   02/17/18 1515  BP: (!) 145/95  Pulse: 70    General Examination: The patient is a very pleasant 32 y.o. female in no acute distress. She appears well-developed and well-nourished and well groomed.   HEENT: Normocephalic, atraumatic, pupils are equal, round and reactive to light and accommodation. Extraocular tracking is good without limitation to gaze excursion or nystagmus noted. Normal smooth pursuit is noted. Hearing is grossly intact. Tympanic membranes are clear bilaterally. Face is symmetric with normal facial animation and normal facial sensation. Speech is clear with no  dysarthria noted. There is no hypophonia. There is no lip, neck/head, jaw or voice tremor. Neck is supple with full range of passive and active motion. There are no carotid bruits on auscultation. Oropharynx exam reveals: mild mouth dryness, adequate dental hygiene, retainer in place, and mild airway crowding, due to smaller airway entry, wider uvula and tonsils in place, about 1+ bilaterally. Mallampati is class I. Neck circumference is 15-1/8 inches. She has a mild overbite.  Chest: Clear to auscultation without wheezing, rhonchi or crackles noted.  Heart: S1+S2+0, regular and normal without murmurs, rubs or gallops noted.   Abdomen: Soft, non-tender and non-distended with normal bowel sounds appreciated on auscultation.  Extremities:  There is no pitting edema in the distal lower extremities bilaterally.   Skin: Warm and dry without trophic changes noted.  Musculoskeletal: exam reveals no obvious joint deformities, tenderness or joint swelling or erythema.   Neurologically:  Mental status: The patient is awake, alert and oriented in all 4 spheres. Her immediate and remote memory, attention, language skills and fund of knowledge are appropriate. There is no evidence of aphasia, agnosia, apraxia or anomia. Speech is clear with normal prosody and enunciation. Thought process is linear. Mood is normal and affect is normal.  Cranial nerves II - XII are as described above under HEENT exam. In addition: shoulder shrug is normal with equal shoulder height noted. Motor exam: Normal bulk, strength and tone is noted. There is no drift, tremor or rebound. Romberg is negative. Reflexes are 2+ throuout. Fine motor skills and coordination: grossly intact.  Cerebellar testing: No dysmetria or intention tremor. There is no truncal or gait ataxia.  Sensory exam: intact to light touch in the upper and lower extremities.  Gait, station and balance: She stands easily. No veering to one side is noted. No leaning to  one side is noted. Posture is age-appropriate and stance is narrow based. Gait shows normal stride length and normal pace. No problems turning are noted. Tandem walk is unremarkable.   Assessment and Plan:   In summary, Vivianna Piccini is a very pleasant 32 y.o.-year old female  with an underlying medical history of seasonal allergies, hyperlipidemia, hypertension, migraine headaches, prior smoking, and morbid obesity with a BMI of over 40, whose history, FHx, and physical exam are concerning for obstructive sleep apnea (OSA).  I had a long chat with the patient about my findings and the diagnosis of OSA, its prognosis and treatment options. We talked about medical treatments, surgical interventions and non-pharmacological approaches. I explained in particular the risks and ramifications of untreated moderate to severe OSA, especially with respect to developing cardiovascular disease down the Road, including congestive heart failure, difficult to treat hypertension, cardiac arrhythmias, or stroke. Even type 2 diabetes has, in part, been linked to untreated OSA. Symptoms of untreated OSA include daytime sleepiness, memory problems, mood irritability and mood disorder such as depression and anxiety, lack of energy, as well as recurrent headaches, especially morning headaches. We talked about trying to maintain a healthy lifestyle in general, as well as the importance of weight control. I encouraged the patient to eat healthy, exercise daily and keep well hydrated, to keep a scheduled bedtime and wake time routine, to not skip any meals and eat healthy snacks in between meals. I advised the patient not to drive when feeling sleepy. I recommended the following at this time: sleep study with potential positive airway pressure titration. (We will score hypopneas at 4%).   I explained the sleep test procedure to the patient and also outlined possible surgical and non-surgical treatment options of OSA, including the  use of a custom-made dental device (which would require a referral to a specialist dentist or oral surgeon), upper airway surgical options, such as pillar implants, radiofrequency surgery, tongue base surgery, and UPPP (which would involve a referral to an ENT surgeon). Rarely, jaw surgery such as mandibular advancement may be considered.  I also explained the CPAP treatment option to the patient, who indicated that he would be willing to try CPAP if the need arises. I explained the importance of being compliant with PAP treatment, not only for insurance purposes but primarily to improve Her symptoms, and for  the patient's long term health benefit, including to reduce Her cardiovascular risks. I answered all her questions today and the patient was in agreement. I plan to see her back after the sleep study is completed and encouraged her to call with any interim questions, concerns, problems or updates.   Thank you very much for allowing me to participate in the care of this nice patient. If I can be of any further assistance to you please do not hesitate to call me at [phone].  Sincerely,   Huston Foley, MD, PhD

## 2018-02-17 NOTE — Telephone Encounter (Signed)
She was to have follow up at 6 weeks after starting medication, please schedule with me for med. Follow up

## 2018-02-17 NOTE — Patient Instructions (Addendum)

## 2018-02-17 NOTE — Telephone Encounter (Signed)
Patient is now taking 1 tablet.  She is doing well and would like to continue the medicaiton.  Took last pill today.  Please advise.

## 2018-02-18 ENCOUNTER — Encounter: Payer: Self-pay | Admitting: Family Medicine

## 2018-02-18 ENCOUNTER — Ambulatory Visit: Payer: 59 | Admitting: Family Medicine

## 2018-02-18 VITALS — BP 112/78 | HR 66 | Temp 98.6°F | Ht 64.0 in | Wt 236.4 lb

## 2018-02-18 DIAGNOSIS — I10 Essential (primary) hypertension: Secondary | ICD-10-CM | POA: Diagnosis not present

## 2018-02-18 DIAGNOSIS — G43109 Migraine with aura, not intractable, without status migrainosus: Secondary | ICD-10-CM

## 2018-02-18 MED ORDER — PHENTERMINE-TOPIRAMATE ER 3.75-23 MG PO CP24: 1.0000 | ORAL_CAPSULE | Freq: Every day | ORAL | 0 refills | Status: DC

## 2018-02-18 NOTE — Progress Notes (Signed)
Brandi Navarro is a 32 y.o. female is here for follow up.  History of Present Illness:   HPI: Doing well. Started Toprol XL at last visit. Headaches and BP improved. Anxiety slightly better. Intersted in working on weight loss now. We discussion Qsymia at the last visit and she thought about it over the past month. Not exercising or working on healthy food choices.   Review of Systems  Constitutional: Negative for chills and fever.  Respiratory: Negative for cough.   Cardiovascular: Negative for chest pain, palpitations and leg swelling.  Gastrointestinal: Negative for nausea and vomiting.  Neurological: Negative for dizziness.    Health Maintenance Due  Topic Date Due  . INFLUENZA VACCINE  01/27/2018   Depression screen PHQ 2/9 12/20/2017  Decreased Interest 0  Down, Depressed, Hopeless 1  PHQ - 2 Score 1  Altered sleeping 2  Tired, decreased energy 3  Change in appetite 2  Feeling bad or failure about yourself  0  Trouble concentrating 0  Moving slowly or fidgety/restless 0  Suicidal thoughts 0  PHQ-9 Score 8  Difficult doing work/chores Not difficult at all   PMHx, SurgHx, SocialHx, FamHx, Medications, and Allergies were reviewed in the Visit Navigator and updated as appropriate.   Patient Active Problem List   Diagnosis Date Noted  . Malaise and fatigue 12/20/2017  . 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 Migraine with aura and without status migrainosus, not intractable 12/19/2017  . PMDD (premenstrual dysphoric disorder) 12/19/2017  . Seasonal allergic rhinitis due to pollen 12/19/2017  . Essential hypertension 12/19/2017  . Marijuana user 12/19/2017  . Pure hypercholesterolemia 12/19/2017   Social History   Tobacco Use  . Smoking status: Former Smoker    Types: E-cigarettes  . Smokeless tobacco: Never Used  Substance Use Topics  . Alcohol use: Yes    Alcohol/week: 2.0 - 3.0 standard drinks    Types: 2 - 3 Standard drinks or equivalent per  week  . Drug use: Yes    Types: Marijuana    Comment: daily   Current Medications and Allergies:   .  citalopram (CELEXA) 20 MG tablet, Start with 1/2 a tablet a day, if tolerating after one week increase to 1 tablet a day, Disp: 30 tablet, Rfl: 1 .  IBUPROFEN PO, Take by mouth., Disp: , Rfl:  .  loratadine (CLARITIN) 10 MG tablet, Take 10 mg by mouth daily., Disp: , Rfl:  .  metoprolol succinate (TOPROL-XL) 25 MG 24 hr tablet, Take 1 tablet (25 mg total) by mouth daily., Disp: 90 tablet, Rfl: 2 .  Multiple Vitamins-Minerals (MULTIVITAMIN PO), Take by mouth daily., Disp: , Rfl:  .  valACYclovir (VALTREX) 500 MG tablet, *TAKE 2 TABLETS BY MOUTH TWICE DAILY AT ONSET OF OUTBREAK**, Disp: 45 tablet, Rfl: 6  No Known Allergies   Review of Systems   Pertinent items are noted in the HPI. Otherwise, ROS is negative.  Vitals:   Vitals:   02/18/18 1539  BP: 112/78  Pulse: 66  Temp: 98.6 F (37 C)  TempSrc: Oral  SpO2: 99%  Weight: 236 lb 6.4 oz (107.2 kg)  Height: 5\' 4"  (1.626 m)     Body mass index is 40.58 kg/m.  Physical Exam:   Physical Exam  Constitutional: She appears well-nourished.  HENT:  Head: Normocephalic and atraumatic.  Eyes: Pupils are equal, round, and reactive to light. EOM are normal.  Neck: Normal range of motion. Neck supple.  Cardiovascular: Normal rate, regular rhythm,  normal heart sounds and intact distal pulses.  Pulmonary/Chest: Effort normal.  Abdominal: Soft.  Skin: Skin is warm.  Psychiatric: She has a normal mood and affect. Her behavior is normal.  Nursing note and vitals reviewed.  Assessment and Plan:   Brandi Navarro was seen today for follow-up.  Diagnoses and all orders for this visit:  Essential hypertension Comments: Controlled now.   Migraine with aura and without status migrainosus, not intractable Comments: Improved with current medication regimen.   Morbid obesity (HCC) -     Phentermine-Topiramate 3.75-23 MG CP24; Take 1  capsule by mouth daily.   . Reviewed expectations re: course of current medical issues. . Discussed self-management of symptoms. . Outlined signs and symptoms indicating need for more acute intervention. . Patient verbalized understanding and all questions were answered. Marland Kitchen Health Maintenance issues including appropriate healthy diet, exercise, and smoking avoidance were discussed with patient. . See orders for this visit as documented in the electronic medical record. . Patient received an After Visit Summary.  Helane Rima, DO [place] 02/19/2018

## 2018-03-08 ENCOUNTER — Telehealth: Payer: Self-pay

## 2018-03-08 DIAGNOSIS — G4719 Other hypersomnia: Secondary | ICD-10-CM

## 2018-03-08 NOTE — Telephone Encounter (Signed)
[employer] denied in lab sleep study after sending office notes. Need HST order

## 2018-03-08 NOTE — Telephone Encounter (Signed)
VO for HST from Dr. Athar received. HST order placed.  

## 2018-03-22 ENCOUNTER — Encounter: Payer: Self-pay | Admitting: Family Medicine

## 2018-03-22 ENCOUNTER — Ambulatory Visit: Payer: 59 | Admitting: Family Medicine

## 2018-03-22 VITALS — BP 128/84 | HR 90 | Temp 98.8°F | Ht 64.0 in | Wt 239.4 lb

## 2018-03-22 DIAGNOSIS — E669 Obesity, unspecified: Secondary | ICD-10-CM | POA: Diagnosis not present

## 2018-03-22 DIAGNOSIS — G43109 Migraine with aura, not intractable, without status migrainosus: Secondary | ICD-10-CM

## 2018-03-22 DIAGNOSIS — I10 Essential (primary) hypertension: Secondary | ICD-10-CM

## 2018-03-22 MED ORDER — TOPIRAMATE ER 50 MG PO CAP24: 1.0000 | ORAL_CAPSULE | Freq: Every day | ORAL | 1 refills | Status: DC

## 2018-03-22 NOTE — Progress Notes (Signed)
Brandi Navarro is a 32 y.o. female is here for follow up.  History of Present Illness:   Barnie Mort, CMA acting as scribe for Dr. Helane Rima.   HPI: Patient in office for weight follow up. She was started on Phentermine-topiramate at last office visit. She was only able to take for about five days. Patient states it gave her very bad headaches. She states she tried taking different times of the day. She was getting plenty of water while taking.   Blood pressure:  Avoiding excessive salt intake? [x]   YES  []   NO no changes in diet Trying to exercise on a regular basis? [x]   YES  []   NO 3 days a week for over one hour  Review: taking medications as instructed, no medication side effects noted, no TIAs, no chest pain on exertion, no dyspnea on exertion, no swelling of ankles. She did have swelling in legs one day after forgetting her medications.  Smoker: No.  Wt Readings from Last 3 Encounters:  03/22/18 239 lb 6.4 oz (108.6 kg)  02/18/18 236 lb 6.4 oz (107.2 kg)  02/17/18 234 lb (106.1 kg)   BP Readings from Last 3 Encounters:  03/22/18 128/84  02/18/18 112/78  02/17/18 (!) 145/95   Lab Results  Component Value Date   CREATININE 0.79 12/20/2017   There are no preventive care reminders to display for this patient.   Depression screen [facility] 2/9 12/20/2017  Decreased Interest 0  Down, Depressed, Hopeless 1  PHQ - 2 Score 1  Altered sleeping 2  Tired, decreased energy 3  Change in appetite 2  Feeling bad or failure about yourself  0  Trouble concentrating 0  Moving slowly or fidgety/restless 0  Suicidal thoughts 0  PHQ-9 Score 8  Difficult doing work/chores Not difficult at all   PMHx, SurgHx, SocialHx, FamHx, Medications, and Allergies were reviewed in the Visit Navigator and updated as appropriate.   Patient Active Problem List   Diagnosis Date Noted  . Morbid obesity (HCC) 03/22/2018  . 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 Migraine with aura  and without status migrainosus, not intractable 12/19/2017  . PMDD (premenstrual dysphoric disorder) 12/19/2017  . Seasonal allergic rhinitis due to pollen 12/19/2017  . Essential hypertension 12/19/2017  . Marijuana user 12/19/2017  . Pure hypercholesterolemia 12/19/2017   Social History   Tobacco Use  . Smoking status: Former Smoker    Types: E-cigarettes  . Smokeless tobacco: Never Used  Substance Use Topics  . Alcohol use: Yes    Alcohol/week: 2.0 - 3.0 standard drinks    Types: 2 - 3 Standard drinks or equivalent per week  . Drug use: Yes    Types: Marijuana    Comment: daily   Current Medications and Allergies:    .  citalopram (CELEXA) 20 MG tablet, TAKE 1 TABLET BY MOUTH ONCE A DAY, Disp: 90 tablet, Rfl: 3 .  IBUPROFEN PO, Take by mouth., Disp: , Rfl:  .  loratadine (CLARITIN) 10 MG tablet, Take 10 mg by mouth daily., Disp: , Rfl:  .  metoprolol succinate (TOPROL-XL) 25 MG 24 hr tablet, Take 1 tablet (25 mg total) by mouth daily., Disp: 90 tablet, Rfl: 2 .  Multiple Vitamins-Minerals (MULTIVITAMIN PO), Take by mouth daily., Disp: , Rfl:  .  valACYclovir (VALTREX) 500 MG tablet, *TAKE 2 TABLETS BY MOUTH TWICE DAILY AT ONSET OF OUTBREAK**, Disp: 45 tablet, Rfl: 6  No Known Allergies   Review of Systems  Pertinent items are noted in the HPI. Otherwise, ROS is negative.  Vitals:   Vitals:   03/22/18 1405  BP: 128/84  Pulse: 90  Temp: 98.8 F (37.1 C)  TempSrc: Oral  SpO2: 96%  Weight: 239 lb 6.4 oz (108.6 kg)  Height: 5\' 4"  (1.626 m)     Body mass index is 41.09 kg/m.  Physical Exam:   Physical Exam  Constitutional: She appears well-nourished.  HENT:  Head: Normocephalic and atraumatic.  Eyes: Pupils are equal, round, and reactive to light. EOM are normal.  Neck: Normal range of motion. Neck supple.  Cardiovascular: Normal rate, regular rhythm, normal heart sounds and intact distal pulses.  Pulmonary/Chest: Effort normal.  Abdominal: Soft.  Skin:  Skin is warm.  Psychiatric: She has a normal mood and affect. Her behavior is normal.  Nursing note and vitals reviewed.  Assessment and Plan:   [name] was seen today for follow-up.  Diagnoses and all orders for this visit:  Migraine with aura and without status migrainosus, not intractable Comments: Home sleep study next week. Morning headaches back. Anticipate positive result. Trial Trokendi for migraine. Sample started pack provided. Orders: -     Topiramate ER (TROKENDI XR) 50 MG CP24; Take 1 tablet by mouth daily.  Essential hypertension Comments: At goal.  Obesity (BMI 30-39.9) Comments: Reviewed healthy eating and exercises.   . Reviewed expectations re: course of current medical issues. . Discussed self-management of symptoms. . Outlined signs and symptoms indicating need for more acute intervention. . Patient verbalized understanding and all questions were answered. Brandi Navarro. Health Maintenance issues including appropriate healthy diet, exercise, and smoking avoidance were discussed with patient. . See orders for this visit as documented in the electronic medical record. . Patient received an After Visit Summary.  CMA served as Neurosurgeonscribe during this visit. History, Physical, and Plan performed by medical provider. The above documentation has been reviewed and is accurate and complete. Helane RimaErica Kyrsten Deleeuw, D.O.  Helane RimaErica Annalisse Minkoff, DO [place], [place] [facility] 03/27/2018

## 2018-03-23 ENCOUNTER — Ambulatory Visit (INDEPENDENT_AMBULATORY_CARE_PROVIDER_SITE_OTHER): Payer: 59 | Admitting: Neurology

## 2018-03-23 DIAGNOSIS — G4733 Obstructive sleep apnea (adult) (pediatric): Secondary | ICD-10-CM

## 2018-03-23 DIAGNOSIS — G4719 Other hypersomnia: Secondary | ICD-10-CM

## 2018-03-29 ENCOUNTER — Telehealth: Payer: Self-pay | Admitting: *Deleted

## 2018-03-29 NOTE — Procedures (Signed)
[facility] [address] [place], [place] [address] NAME:   Brandi Navarro                                                               DOB: Jan 18, 1986 MEDICAL RECORD NUMBER [id number]                                               DOS: 03/23/2018  REFERRING PHYSICIAN: Helane Rima, DO STUDY PERFORMED: Home Sleep Test HISTORY: 32 year old woman with a history of seasonal allergies, hyperlipidemia, hypertension, migraine headaches, prior smoking, and obesity, who reports excessive daytime somnolence. Epworth sleepiness score is 9/24. BMI 39.9.   STUDY RESULTS:  Total Recording Time: 9 hours, 35 minutes (valid test time: 7 hours, 39 min) Total Apnea/Hypopnea Index (AHI): 18.2/h, RDI: 19.1/h Average Oxygen Saturation: 93%, Lowest Oxygen Desaturation: 88%  Total Time Oxygen Saturation Below or at 88%: 0.2 minutes  Average Heart Rate: 80 bpm (between 53 and 126 bpm) IMPRESSION: OSA RECOMMENDATION: This home sleep test demonstrates moderate obstructive sleep apnea (by number of events) with a total AHI of 18.2/hour and O2 nadir of 88%. Given the patient's medical history and sleep related complaints, treatment with positive airway pressure (in the form of CPAP) is recommended. This will require a full night CPAP titration study for proper treatment settings, O2 monitoring and mask fitting. Based on the moderate severity of the sleep disordered breathing an attended titration study is indicated. However, patient's insurance has denied an attended sleep study; therefore, the patient will be advised to proceed with an autoPAP titration/trial at home for now. Please note that untreated obstructive sleep apnea may carry additional perioperative morbidity. Patients with significant obstructive sleep apnea should receive perioperative PAP therapy and the surgeons and particularly the anesthesiologist should be informed of the diagnosis and the severity of the sleep disordered  breathing. The patient should be cautioned not to drive, work at heights, or operate dangerous or heavy equipment when tired or sleepy. Review and reiteration of good sleep hygiene measures should be pursued with any patient. Other causes of the patient's symptoms, including circadian rhythm disturbances, an underlying mood disorder, medication effect and/or an underlying medical problem cannot be ruled out based on this test. Clinical correlation is recommended. The patient and his referring provider will be notified of the test results. The patient will be seen in follow up in sleep clinic at [facility]. I certify that I have reviewed the raw data recording prior to the issuance of this report in accordance with the standards of the American Academy of Sleep Medicine (AASM).  Huston Foley, MD, PhD [facility]) Diplomat, ABPN (Neurology and Sleep)

## 2018-03-29 NOTE — Telephone Encounter (Signed)
Pt returned my call. I advised pt that Dr. Frances Furbish reviewed their sleep study results and found that pt has moderate osa. Dr. Frances Furbish recommends that pt start an auto pap since pt's insurance will likely deny an in-lab titration study. I reviewed PAP compliance expectations with the pt. Pt is agreeable to starting an auto-PAP. I advised pt that an order will be sent to a DME, [facility], and [facility] will call the pt within about one week after they file with the pt's insurance. [facility] will show the pt how to use the machine, fit for masks, and troubleshoot the auto-PAP if needed. A follow up appt was made for insurance purposes with Dr. Frances Furbish on 06/09/18 at 3:00pm. Pt verbalized understanding to arrive 15 minutes early and bring their auto-PAP. A letter with all of this information in it will be sent to the pt's mychart account as a reminder. I verified with the pt that the address we have on file is correct. Pt verbalized understanding of results. Pt had no questions at this time but was encouraged to call back if questions arise.

## 2018-03-29 NOTE — Addendum Note (Signed)
Addended by: Huston Foley on: 03/29/2018 08:19 AM   Modules accepted: Orders

## 2018-03-29 NOTE — Telephone Encounter (Signed)
-----   Message from Huston Foley, MD sent at 03/29/2018  8:19 AM EDT ----- Patient referred by Dr. Earlene Plater, seen by me on 02/17/18, hst on 03/23/18.    Please call and notify the patient that the recent home sleep test showed obstructive sleep apnea in the moderate range. While I recommend treatment for this in the form CPAP, her insurance will not approve a sleep study for this. They will likely only approve a trial of autoPAP, which means, that we don't have to bring her in for a sleep study with CPAP, but will let her try an autoPAP machine at home, through a DME company (of her choice, or as per insurance requirement). The DME representative will educate her on how to use the machine, how to put the mask on, etc. I have placed an order in the chart. Please send referral, talk to patient, send report to referring MD. We will need a FU in sleep clinic for 10 weeks post-PAP set up, please arrange that with me or one of our NPs. Thanks,   Huston Foley, MD, PhD [facility])

## 2018-03-29 NOTE — Progress Notes (Signed)
Patient referred by Dr. Earlene Plater, seen by me on 02/17/18, hst on 03/23/18.    Please call and notify the patient that the recent home sleep test showed obstructive sleep apnea in the moderate range. While I recommend treatment for this in the form CPAP, her insurance will not approve a sleep study for this. They will likely only approve a trial of autoPAP, which means, that we don't have to bring her in for a sleep study with CPAP, but will let her try an autoPAP machine at home, through a DME company (of her choice, or as per insurance requirement). The DME representative will educate her on how to use the machine, how to put the mask on, etc. I have placed an order in the chart. Please send referral, talk to patient, send report to referring MD. We will need a FU in sleep clinic for 10 weeks post-PAP set up, please arrange that with me or one of our NPs. Thanks,   Huston Foley, MD, PhD [facility])

## 2018-03-29 NOTE — Telephone Encounter (Signed)
Called, LVM for pt to call about results.  

## 2018-03-29 NOTE — Progress Notes (Signed)
cpap

## 2018-03-31 ENCOUNTER — Telehealth: Payer: Self-pay

## 2018-03-31 NOTE — Telephone Encounter (Signed)
Krystin Keeven (Key: [id number])   [facility] is reviewing your PA request. Typically an electronic response will be received within 72 hours. To check for an update later, open this request from your dashboard. You may close this dialog and return to your dashboard to perform other tasks.  Sinclair Grooms Key: [id number] - PA Case ID: [id number] - Rx #: [id number] Need help? Call us at [phone]  Status  Sent to [employer]  DrugTrokendi XR 50MG  er capsules  Form[employer] Electronic Prior Authorization Form  Original Claim Info70

## 2018-04-04 NOTE — Telephone Encounter (Addendum)
Fax received from [facility]  aproval for trokendi 50 mg through 04-01-2019 Letter sent to scan and pharmacy called to let them know.

## 2018-04-06 DIAGNOSIS — G4733 Obstructive sleep apnea (adult) (pediatric): Secondary | ICD-10-CM | POA: Diagnosis not present

## 2018-04-07 ENCOUNTER — Encounter: Payer: Self-pay | Admitting: Family Medicine

## 2018-05-07 DIAGNOSIS — G4733 Obstructive sleep apnea (adult) (pediatric): Secondary | ICD-10-CM | POA: Diagnosis not present

## 2018-05-19 ENCOUNTER — Ambulatory Visit: Payer: 59 | Admitting: Physician Assistant

## 2018-05-19 ENCOUNTER — Encounter: Payer: Self-pay | Admitting: Physician Assistant

## 2018-05-19 VITALS — BP 122/80 | HR 76 | Temp 98.8°F | Ht 64.0 in | Wt 238.2 lb

## 2018-05-19 DIAGNOSIS — J029 Acute pharyngitis, unspecified: Secondary | ICD-10-CM | POA: Diagnosis not present

## 2018-05-19 LAB — POCT RAPID STREP A (OFFICE): Rapid Strep A Screen: NEGATIVE

## 2018-05-19 MED ORDER — AMOXICILLIN 875 MG PO TABS: 875.0000 mg | ORAL_TABLET | Freq: Two times a day (BID) | ORAL | 0 refills | Status: DC

## 2018-05-19 NOTE — Progress Notes (Signed)
Brandi Navarro is a 32 y.o. female here for a new problem.  I acted as a Neurosurgeon for [employer], PA-C Corky Mull, LPN  History of Present Illness:   Chief Complaint  Patient presents with  . Sore Throat    Sore Throat   This is a new problem. Episode onset: Started yesterday when she woke up was sore. The problem has been gradually worsening. Neither side of throat is experiencing more pain than the other. There has been no fever. The pain is at a severity of 6/10. The pain is moderate. Associated symptoms include ear pain, a hoarse voice, shortness of breath, swollen glands and trouble swallowing. Pertinent negatives include no abdominal pain, congestion, coughing, diarrhea, ear discharge, plugged ear sensation, neck pain or vomiting. She has tried NSAIDs for the symptoms. The treatment provided mild relief.   She is traveling for thanksgiving soon and she wants to make sure she doesn't have strep.  Past Medical History:  Diagnosis Date  . Asthma   . HSV2   . Migraines with aura      Social History   Socioeconomic History  . Marital status: Single    Spouse name: Not on file  . Number of children: Not on file  . Years of education: Not on file  . Highest education level: Not on file  Occupational History    Employer: [employer]  Social Needs  . Financial resource strain: Not on file  . Food insecurity:    Worry: Not on file    Inability: Not on file  . Transportation needs:    Medical: Not on file    Non-medical: Not on file  Tobacco Use  . Smoking status: Former Smoker    Types: E-cigarettes  . Smokeless tobacco: Never Used  Substance and Sexual Activity  . Alcohol use: Yes    Alcohol/week: 2.0 - 3.0 standard drinks    Types: 2 - 3 Standard drinks or equivalent per week  . Drug use: Yes    Types: Marijuana    Comment: daily  . Sexual activity: Yes    Partners: Male    Birth control/protection: Condom  Lifestyle  . Physical activity:    Days  per week: Not on file    Minutes per session: Not on file  . Stress: Not on file  Relationships  . Social connections:    Talks on phone: Not on file    Gets together: Not on file    Attends religious service: Not on file    Active member of club or organization: Not on file    Attends meetings of clubs or organizations: Not on file    Relationship status: Not on file  . Intimate partner violence:    Fear of current or ex partner: Not on file    Emotionally abused: Not on file    Physically abused: Not on file    Forced sexual activity: Not on file  Other Topics Concern  . Not on file  Social History Narrative  . Not on file    History reviewed. No pertinent surgical history.  Family History  Problem Relation Age of Onset  . Cancer Other   . Diabetes Maternal Grandfather   . Hypertension Maternal Grandfather   . Anemia Mother   . Anemia Sister   . Anemia Maternal Grandmother     No Known Allergies  Current Medications:   Current Outpatient Medications:  .  citalopram (CELEXA) 20 MG tablet, TAKE 1 TABLET  BY MOUTH ONCE A DAY, Disp: 90 tablet, Rfl: 3 .  IBUPROFEN PO, Take by mouth., Disp: , Rfl:  .  loratadine (CLARITIN) 10 MG tablet, Take 10 mg by mouth daily., Disp: , Rfl:  .  metoprolol succinate (TOPROL-XL) 25 MG 24 hr tablet, Take 1 tablet (25 mg total) by mouth daily., Disp: 90 tablet, Rfl: 2 .  Multiple Vitamins-Minerals (MULTIVITAMIN PO), Take by mouth daily., Disp: , Rfl:  .  Topiramate ER (TROKENDI XR) 50 MG CP24, Take 1 tablet by mouth daily., Disp: 30 capsule, Rfl: 1 .  valACYclovir (VALTREX) 500 MG tablet, *TAKE 2 TABLETS BY MOUTH TWICE DAILY AT ONSET OF OUTBREAK**, Disp: 45 tablet, Rfl: 6 .  amoxicillin (AMOXIL) 875 MG tablet, Take 1 tablet (875 mg total) by mouth 2 (two) times daily., Disp: 20 tablet, Rfl: 0   Review of Systems:   Review of Systems  HENT: Positive for ear pain, hoarse voice and trouble swallowing. Negative for congestion and ear  discharge.   Respiratory: Positive for shortness of breath. Negative for cough.   Gastrointestinal: Negative for abdominal pain, diarrhea and vomiting.  Musculoskeletal: Negative for neck pain.    Vitals:   Vitals:   05/19/18 1024  BP: 122/80  Pulse: 76  Temp: 98.8 F (37.1 C)  TempSrc: Oral  SpO2: 98%  Weight: 238 lb 4 oz (108.1 kg)  Height: 5\' 4"  (1.626 m)     Body mass index is 40.9 kg/m.  Physical Exam:   Physical Exam  Constitutional: She appears well-developed. She is cooperative.  Non-toxic appearance. She does not have a sickly appearance. She does not appear ill. No distress.  HENT:  Head: Normocephalic and atraumatic.  Right Ear: Tympanic membrane, external ear and ear canal normal. Tympanic membrane is not erythematous, not retracted and not bulging.  Left Ear: Tympanic membrane, external ear and ear canal normal. Tympanic membrane is not erythematous, not retracted and not bulging.  Nose: Mucosal edema and rhinorrhea present. Right sinus exhibits no maxillary sinus tenderness and no frontal sinus tenderness. Left sinus exhibits no maxillary sinus tenderness and no frontal sinus tenderness.  Mouth/Throat: Uvula is midline and mucous membranes are normal. Posterior oropharyngeal erythema present. No posterior oropharyngeal edema. Tonsils are 1+ on the right. Tonsils are 1+ on the left. Tonsillar exudate.  Eyes: Conjunctivae and lids are normal.  Neck: Trachea normal.  Cardiovascular: Normal rate, regular rhythm, S1 normal, S2 normal and normal heart sounds.  Pulmonary/Chest: Effort normal and breath sounds normal. She has no decreased breath sounds. She has no wheezes. She has no rhonchi. She has no rales.  Lymphadenopathy:    She has no cervical adenopathy.  Neurological: She is alert.  Skin: Skin is warm, dry and intact.  Psychiatric: She has a normal mood and affect. Her speech is normal and behavior is normal.  Nursing note and vitals reviewed.   Results for  orders placed or performed in visit on 05/19/18  POCT rapid strep A  Result Value Ref Range   Rapid Strep A Screen Negative Negative    Assessment and Plan:   [name] was seen today for sore throat.  Diagnoses and all orders for this visit:  Sore throat -     POCT rapid strep A  Other orders -     amoxicillin (AMOXIL) 875 MG tablet; Take 1 tablet (875 mg total) by mouth 2 (two) times daily.   No red flags on exam.  Strep test negative. Discussed supportive care. We did talk  about option of doing depomedrol injection to help with inflammation but she declined. Discussed taking medications as prescribed. Reviewed return precautions including worsening fever, SOB, worsening cough or other concerns. Push fluids and rest. I recommend that patient follow-up if symptoms worsen or persist despite treatment x 7-10 days, sooner if needed.  I gave her a safety net prescription of Amoxicillin.  . Reviewed expectations re: course of current medical issues. . Discussed self-management of symptoms. . Outlined signs and symptoms indicating need for more acute intervention. . Patient verbalized understanding and all questions were answered. . See orders for this visit as documented in the electronic medical record. . Patient received an After-Visit Summary.  CMA or LPN served as scribe during this visit. History, Physical, and Plan performed by medical provider. The above documentation has been reviewed and is accurate and complete.  Jarold Motto, PA-C

## 2018-05-19 NOTE — Patient Instructions (Addendum)
It was great to see you!  You have a viral upper respiratory infection. Antibiotics are not needed for this.  Viral infections usually take 7-10 days to resolve.   If symptoms do not improve, may start Amoxicillin.  Push fluids and get plenty of rest. Please return if you are not improving as expected, or if you have high fevers (>101.5) or difficulty swallowing or worsening productive cough.  Call clinic with questions.  I hope you start feeling better soon!

## 2018-05-20 ENCOUNTER — Encounter: Payer: Self-pay | Admitting: Physician Assistant

## 2018-06-02 NOTE — Telephone Encounter (Signed)
Received notice from [facility] that pt has returned her auto pap. Pt has an appt with us on 06/09/18. I called her to discuss this appt; pt may keep this appt if she wishes to discuss other tx options for osa. No answer, left a message asking her to call me back. I will also send pt a mychart message.

## 2018-06-06 ENCOUNTER — Other Ambulatory Visit: Payer: Self-pay | Admitting: Family Medicine

## 2018-06-06 DIAGNOSIS — G43109 Migraine with aura, not intractable, without status migrainosus: Secondary | ICD-10-CM

## 2018-06-09 ENCOUNTER — Ambulatory Visit: Payer: Self-pay | Admitting: Neurology

## 2018-06-17 ENCOUNTER — Encounter: Payer: Self-pay | Admitting: Family Medicine

## 2018-06-17 ENCOUNTER — Ambulatory Visit: Payer: 59 | Admitting: Family Medicine

## 2018-06-17 VITALS — BP 124/76 | HR 85 | Temp 98.7°F | Ht 64.0 in | Wt 239.4 lb

## 2018-06-17 DIAGNOSIS — G4733 Obstructive sleep apnea (adult) (pediatric): Secondary | ICD-10-CM | POA: Diagnosis not present

## 2018-06-17 DIAGNOSIS — Z23 Encounter for immunization: Secondary | ICD-10-CM

## 2018-06-17 DIAGNOSIS — G43109 Migraine with aura, not intractable, without status migrainosus: Secondary | ICD-10-CM

## 2018-06-17 MED ORDER — TOPIRAMATE ER 50 MG PO CAP24: 1.0000 | ORAL_CAPSULE | Freq: Every day | ORAL | 2 refills | Status: DC

## 2018-06-17 NOTE — Progress Notes (Signed)
Brandi Navarro is a 32 y.o. female is here for follow up.  History of Present Illness:   HPI:   1. Migraines. Improved with Trokendi and wants to continue. Does experience some tingling of hands. No memory or word-finding issues.    2. Need for influenza vaccination. Agrees to get it today.   3. Morbid obesity (HCC). Wants to try another medication. Did not tolerate phentermine due to headaches. Topamax and Metformin have not helped with weight loss. Not exercising regularly. Working on [employer] food choices.    4. OSA (obstructive sleep apnea). Did not tolerate CPAP, so sent back.     There are no preventive care reminders to display for this patient.   [facility] 12/20/2017  Decreased Interest 0  Down, Depressed, Hopeless 1  PHQ - 2 Score 1  Altered sleeping 2  Tired, decreased energy 3  Change in appetite 2  Feeling bad or failure about yourself  0  Trouble concentrating 0  Moving slowly or fidgety/restless 0  Suicidal thoughts 0  PHQ-9 Score 8  Difficult doing work/chores Not difficult at all   PMHx, SurgHx, SocialHx, FamHx, Medications, and Allergies were reviewed in the Visit Navigator and updated as appropriate.   Patient Active Problem List   Diagnosis Date Noted  . Morbid obesity (HCC) 03/22/2018  . 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 Migraine with aura and without status migrainosus, not intractable 12/19/2017  . PMDD (premenstrual dysphoric disorder) 12/19/2017  . Seasonal allergic rhinitis due to pollen 12/19/2017  . Essential hypertension 12/19/2017  . Marijuana user 12/19/2017  . Pure hypercholesterolemia 12/19/2017   Social History   Tobacco Use  . Smoking status: Former Smoker    Types: E-cigarettes  . Smokeless tobacco: Never Used  Substance Use Topics  . Alcohol use: Yes    Alcohol/week: 2.0 - 3.0 standard drinks    Types: 2 - 3 Standard drinks or equivalent per week  . Drug use: Yes    Types: Marijuana   Comment: daily   Current Medications and Allergies:   .  citalopram (CELEXA) 20 MG tablet, TAKE 1 TABLET BY MOUTH ONCE A DAY, Disp: 90 tablet, Rfl: 3 .  IBUPROFEN PO, Take by mouth., Disp: , Rfl:  .  loratadine (CLARITIN) 10 MG tablet, Take 10 mg by mouth daily., Disp: , Rfl:  .  metoprolol succinate (TOPROL-XL) 25 MG 24 hr tablet, Take 1 tablet (25 mg total) by mouth daily., Disp: 90 tablet, Rfl: 2 .  Multiple Vitamins-Minerals (MULTIVITAMIN PO), Take by mouth daily., Disp: , Rfl:  .  TROKENDI XR 50 MG CP24, TAKE 1 CAPSULE BY MOUTH ONCE DAILY, Disp: 30 capsule, Rfl: 1 .  valACYclovir (VALTREX) 500 MG tablet, *TAKE 2 TABLETS BY MOUTH TWICE DAILY AT ONSET OF OUTBREAK**, Disp: 45 tablet, Rfl: 6  No Known Allergies   Review of Systems   Pertinent items are noted in the HPI. Otherwise, a complete ROS is negative.  Vitals:   Vitals:   06/17/18 1605  BP: 124/76  Pulse: 85  Temp: 98.7 F (37.1 C)  TempSrc: Oral  SpO2: 99%  Weight: 239 lb 6.4 oz (108.6 kg)  Height: 5\' 4"  (1.626 m)     Body mass index is 41.09 kg/m.  Physical Exam:   Physical Exam Vitals signs and nursing note reviewed.  HENT:     Head: Normocephalic and atraumatic.  Eyes:     Pupils: Pupils are equal, round, and reactive to light.  Neck:     Musculoskeletal: Normal range of motion and neck supple.  Cardiovascular:     Rate and Rhythm: Normal rate and regular rhythm.     Heart sounds: Normal heart sounds.  Pulmonary:     Effort: Pulmonary effort is normal.  Abdominal:     Palpations: Abdomen is soft.  Skin:    General: Skin is warm.  Psychiatric:        Behavior: Behavior normal.    Assessment and Plan:   Brandi Navarro was seen today for follow-up.  Diagnoses and all orders for this visit:  Migraine with aura and without status migrainosus, not intractable Comments: Migraines improved with Trokendi.  Orders: -     Topiramate ER (TROKENDI XR) 50 MG CP24; Take 1 capsule by mouth daily.  Need for  influenza vaccination -     Cancel: Flu Vaccine QUAD 6+ mos PF IM (Fluarix Quad PF)  Morbid obesity (HCC) Comments: Discussed options. Will trial: Saxenda.  OSA (obstructive sleep apnea) Comments: Did not tolerate CPAP.    Marland Kitchen. Orders and follow up as documented in EpicCare, reviewed diet, exercise and weight control, cardiovascular risk and specific lipid/LDL goals reviewed, reviewed medications and side effects in detail.  . Reviewed expectations re: course of current medical issues. . Outlined signs and symptoms indicating need for more acute intervention. . Patient verbalized understanding and all questions were answered. . Patient received an After Visit Summary.  Helane RimaErica Myna Freimark, DO [place], [facility] 06/19/2018

## 2018-06-27 ENCOUNTER — Telehealth: Payer: Self-pay | Admitting: Certified Nurse Midwife

## 2018-06-27 DIAGNOSIS — Z30430 Encounter for insertion of intrauterine contraceptive device: Secondary | ICD-10-CM

## 2018-06-27 NOTE — Telephone Encounter (Signed)
Encounter closed

## 2018-06-27 NOTE — Telephone Encounter (Signed)
Sounds fine 

## 2018-06-27 NOTE — Progress Notes (Signed)
GYNECOLOGY  VISIT   HPI: 32 y.o.   Single White or Caucasian Not Hispanic or Latino  female   G0P0000 with Patient's last menstrual period was 06/26/2018.   [facility]    She is with a new partner, not using condoms. Regular cycles, on her period. Cycles are monthly x 5-6 days. Saturates a super tampon in 4 hours. No BTB. Cramps are pretty bad.   GYNECOLOGIC HISTORY: Patient's last menstrual period was 06/26/2018. Contraception: Condoms (sometimes) Menopausal hormone therapy: n/a        OB History    Gravida  0   Para  0   Term  0   Preterm  0   AB  0   Living  0     SAB  0   TAB  0   Ectopic  0   Multiple  0   Live Births                 Patient Active Problem List   Diagnosis Date Noted  . Morbid obesity (HCC) 03/22/2018  . 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 Migraine with aura and without status migrainosus, not intractable 12/19/2017  . PMDD (premenstrual dysphoric disorder) 12/19/2017  . Seasonal allergic rhinitis due to pollen 12/19/2017  . Essential hypertension 12/19/2017  . Marijuana user 12/19/2017  . Pure hypercholesterolemia 12/19/2017    Past Medical History:  Diagnosis Date  . Asthma   . HSV2   . Migraines with aura     History reviewed. No pertinent surgical history.  Current Outpatient Medications  Medication Sig Dispense Refill  . citalopram (CELEXA) 20 MG tablet TAKE 1 TABLET BY MOUTH ONCE A DAY 90 tablet 3  . IBUPROFEN PO Take by mouth.    . Liraglutide -Weight Management (SAXENDA) 18 MG/3ML SOPN Inject into the skin.    Marland Kitchen. loratadine (CLARITIN) 10 MG tablet Take 10 mg by mouth daily.    . metoprolol succinate (TOPROL-XL) 25 MG 24 hr tablet Take 1 tablet (25 mg total) by mouth daily. 90 tablet 2  . Multiple Vitamins-Minerals (MULTIVITAMIN PO) Take by mouth daily.    . Topiramate ER (TROKENDI XR) 50 MG CP24 Take 1 capsule by mouth daily. 90 capsule 2  . valACYclovir (VALTREX) 500 MG tablet  *TAKE 2 TABLETS BY MOUTH TWICE DAILY AT ONSET OF OUTBREAK** 45 tablet 6   No current facility-administered medications for this visit.      ALLERGIES: Patient has no known allergies.  Family History  Problem Relation Age of Onset  . Cancer Other   . Diabetes Maternal Grandfather   . Hypertension Maternal Grandfather   . Anemia Mother   . Anemia Sister   . Anemia Maternal Grandmother     Social History   Socioeconomic History  . Marital status: Single    Spouse name: Not on file  . Number of children: Not on file  . Years of education: Not on file  . Highest education level: Not on file  Occupational History    Employer: [employer]  Social Needs  . Financial resource strain: Not on file  . Food insecurity:    Worry: Not on file    Inability: Not on file  . Transportation needs:    Medical: Not on file    Non-medical: Not on file  Tobacco Use  . Smoking status: Former Smoker    Types: E-cigarettes  . Smokeless tobacco: Never Used  Substance and Sexual Activity  .  Alcohol use: Yes    Alcohol/week: 2.0 - 3.0 standard drinks    Types: 2 - 3 Standard drinks or equivalent per week  . Drug use: Yes    Types: Marijuana    Comment: daily  . Sexual activity: Yes    Partners: Male    Birth control/protection: Condom  Lifestyle  . Physical activity:    Days per week: Not on file    Minutes per session: Not on file  . Stress: Not on file  Relationships  . Social connections:    Talks on phone: Not on file    Gets together: Not on file    Attends religious service: Not on file    Active member of club or organization: Not on file    Attends meetings of clubs or organizations: Not on file    Relationship status: Not on file  . Intimate partner violence:    Fear of current or ex partner: Not on file    Emotionally abused: Not on file    Physically abused: Not on file    Forced sexual activity: Not on file  Other Topics Concern  . Not on file  Social History  Narrative  . Not on file    Review of Systems  Constitutional: Negative.   HENT: Negative.   Eyes: Negative.   Respiratory: Negative.   Cardiovascular: Negative.   Gastrointestinal: Negative.   Genitourinary: Negative.   Musculoskeletal: Negative.   Skin: Negative.   Neurological: Negative.   Endo/Heme/Allergies: Negative.   Psychiatric/Behavioral: Negative.     PHYSICAL EXAMINATION:    BP 106/68 (BP Location: Right Arm, Patient Position: Sitting, Cuff Size: Large)   Pulse 84   Resp 16   Ht 5\' 4"  (1.626 m)   Wt 217 lb (98.4 kg)   LMP 06/26/2018   BMI 37.25 kg/m     General appearance: alert, cooperative and appears stated age  Pelvic: External genitalia:  no lesions              Urethra:  normal appearing urethra with no masses, tenderness or lesions              Bartholins and Skenes: normal                 Vagina: normal appearing vagina with normal color and discharge, no lesions              Cervix: no cervical motion tenderness and no lesions              Bimanual Exam:  Uterus:  normal size, contour, position, consistency, mobility, non-tender and anteverted              Adnexa: no mass, fullness, tenderness                The risks of the kyleena IUD were reviewed with the patient, including infection, abnormal bleeding and uterine perfortion. Consent was signed.  A speculum was placed in the vagina, the cervix was cleansed with betadine. A tenaculum was placed on the cervix, the uterus sounded to 8 cm. The cervix was dilated to a 4 hagar dilator  The kyleena IUD was inserted without difficulty. The string were cut to 3-4 cm. The tenaculum was removed. Slight oozing from the tenaculum site was stopped with pressure.   The patient tolerated the procedure well.    Chaperone was present for exam.  ASSESSMENT Contraception counseling. Discussed different options for IUD's, including side effects and risks  She has a new partner in the last month     PLAN Patient desires Rutha Bouchardkyleena IUD, placed F/U in one month Nuswab sent   An After Visit Summary was printed and given to the patient.  CC: Sara Chuebbie Leonard, CNM

## 2018-06-27 NOTE — Telephone Encounter (Signed)
Message   At my yearly exam we had discussed the birth control option IUD Kyleena since I am not able to be on the pill any longer. I did check with my insurance and it is covered at 100%. I am now actively involved with someone and feel like I need to take this preventative step since a child is not in the plan right now. I understand this needs to be inserted during my period and I did start my period yesterday and am just looking to set up an appointment to come in to have this done. Thank you in advance!

## 2018-06-27 NOTE — Telephone Encounter (Signed)
Spoke with patient. [facility] discussed at [facility] on 01/06/18 with DL. LMP 06/26/18. Patient is G0P0, will schedule with MD. Patient request Dr. Oscar LaJertson.   IUD insertion scheduled for 1/2 at 4pm with Dr. Oscar LaJertson. Order placed for precert. Advised to take Motrin 800 mg with food and water one hour before procedure.   Dr. Oscar LaJertson -ok to proceed as scheduled?  Cc: Leota Sauerseborah Leonard, CNM, [place] Pilonosa Davis, Suzy Durwin Noraixon

## 2018-06-30 ENCOUNTER — Ambulatory Visit (INDEPENDENT_AMBULATORY_CARE_PROVIDER_SITE_OTHER): Payer: 59 | Admitting: Obstetrics and Gynecology

## 2018-06-30 ENCOUNTER — Encounter: Payer: Self-pay | Admitting: Obstetrics and Gynecology

## 2018-06-30 VITALS — BP 106/68 | HR 84 | Resp 16 | Ht 64.0 in | Wt 217.0 lb

## 2018-06-30 DIAGNOSIS — Z30430 Encounter for insertion of intrauterine contraceptive device: Secondary | ICD-10-CM

## 2018-06-30 DIAGNOSIS — Z113 Encounter for screening for infections with a predominantly sexual mode of transmission: Secondary | ICD-10-CM | POA: Diagnosis not present

## 2018-06-30 DIAGNOSIS — Z3009 Encounter for other general counseling and advice on contraception: Secondary | ICD-10-CM | POA: Diagnosis not present

## 2018-06-30 LAB — POCT URINE PREGNANCY: PREG TEST UR: NEGATIVE

## 2018-06-30 NOTE — Patient Instructions (Signed)
IUD Post-procedure Instructions Cramping is common.  You may take Ibuprofen, Aleve, or Tylenol for the cramping.  This should resolve within 24 hours.   You may have a small amount of spotting.  You should wear a mini pad for the next few days. You may have intercourse in 24 hours. You need to call the office if you have any pelvic pain, fever, heavy bleeding, or foul smelling vaginal discharge. Shower or bathe as normal Use back up contraception for one week 

## 2018-07-01 ENCOUNTER — Other Ambulatory Visit: Payer: Self-pay

## 2018-07-01 ENCOUNTER — Ambulatory Visit: Payer: 59 | Admitting: Obstetrics and Gynecology

## 2018-07-01 ENCOUNTER — Encounter: Payer: Self-pay | Admitting: Obstetrics and Gynecology

## 2018-07-01 ENCOUNTER — Telehealth: Payer: Self-pay | Admitting: Obstetrics and Gynecology

## 2018-07-01 VITALS — BP 118/82 | HR 78 | Temp 98.3°F | Resp 14 | Ht 64.0 in

## 2018-07-01 DIAGNOSIS — R102 Pelvic and perineal pain: Secondary | ICD-10-CM | POA: Diagnosis not present

## 2018-07-01 DIAGNOSIS — Z30432 Encounter for removal of intrauterine contraceptive device: Secondary | ICD-10-CM | POA: Diagnosis not present

## 2018-07-01 NOTE — Telephone Encounter (Signed)
Message   I am still cramping pretty bad from getting the IUD inserted yesterday. I slept in a ball with a heating pad most of the night. I took 4 ibuprofren already this morning and have not had any relief.

## 2018-07-01 NOTE — Telephone Encounter (Signed)
1031 (Late entry) Call to patient.  Reports 6/10 pain s/p Motrin. Describes as sharp.  Light vaginal bleeding. At work now.  Offered office visit and patient agreeable, coming from [place]. Coming now.  Encounter closed.

## 2018-07-01 NOTE — Progress Notes (Signed)
GYNECOLOGY  VISIT   HPI: 33 y.o.   Single White or Caucasian Not Hispanic or Latino  female   G0P0000 with Patient's last menstrual period was 06/26/2018.   here for   Pelvic pain following IUD placement. She had a kyleena IUD inserted yesterday. Pain eased up last night. Pain got worse during the night and very bad this morning. She took 800 mg of ibuprofen, no help. Pain is a 5/10 in severity. Bleeding is light.  Her lower abdomen feels like normal cramps, but has shooting pain into the "left ovary" and is having an aching, constant pain in her back.   She was on the fence about the IUD. Is worrying about spotting for months, partner didn't want her to get the IUD.  GYNECOLOGIC HISTORY: Patient's last menstrual period was 06/26/2018. Contraception: IUD  Menopausal hormone therapy: none         OB History    Gravida  0   Para  0   Term  0   Preterm  0   AB  0   Living  0     SAB  0   TAB  0   Ectopic  0   Multiple  0   Live Births                 Patient Active Problem List   Diagnosis Date Noted  . Morbid obesity (HCC) 03/22/2018  . 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 Migraine with aura and without status migrainosus, not intractable 12/19/2017  . PMDD (premenstrual dysphoric disorder) 12/19/2017  . Seasonal allergic rhinitis due to pollen 12/19/2017  . Essential hypertension 12/19/2017  . Marijuana user 12/19/2017  . Pure hypercholesterolemia 12/19/2017    Past Medical History:  Diagnosis Date  . Asthma   . HSV2   . Migraines with aura     History reviewed. No pertinent surgical history.  Current Outpatient Medications  Medication Sig Dispense Refill  . citalopram (CELEXA) 20 MG tablet TAKE 1 TABLET BY MOUTH ONCE A DAY 90 tablet 3  . IBUPROFEN PO Take by mouth.    . Liraglutide -Weight Management (SAXENDA) 18 MG/3ML SOPN Inject into the skin.    Marland Kitchen. loratadine (CLARITIN) 10 MG tablet Take 10 mg by mouth daily.    . metoprolol  succinate (TOPROL-XL) 25 MG 24 hr tablet Take 1 tablet (25 mg total) by mouth daily. 90 tablet 2  . Multiple Vitamins-Minerals (MULTIVITAMIN PO) Take by mouth daily.    . Topiramate ER (TROKENDI XR) 50 MG CP24 Take 1 capsule by mouth daily. 90 capsule 2  . valACYclovir (VALTREX) 500 MG tablet *TAKE 2 TABLETS BY MOUTH TWICE DAILY AT ONSET OF OUTBREAK** 45 tablet 6   No current facility-administered medications for this visit.      ALLERGIES: Patient has no known allergies.  Family History  Problem Relation Age of Onset  . Cancer Other   . Diabetes Maternal Grandfather   . Hypertension Maternal Grandfather   . Anemia Mother   . Anemia Sister   . Anemia Maternal Grandmother     Social History   Socioeconomic History  . Marital status: Single    Spouse name: Not on file  . Number of children: Not on file  . Years of education: Not on file  . Highest education level: Not on file  Occupational History    Employer: [employer]  Social Needs  . Financial resource strain: Not on file  . Food  insecurity:    Worry: Not on file    Inability: Not on file  . Transportation needs:    Medical: Not on file    Non-medical: Not on file  Tobacco Use  . Smoking status: Former Smoker    Types: E-cigarettes  . Smokeless tobacco: Never Used  Substance and Sexual Activity  . Alcohol use: Yes    Alcohol/week: 2.0 - 3.0 standard drinks    Types: 2 - 3 Standard drinks or equivalent per week  . Drug use: Yes    Types: Marijuana    Comment: daily  . Sexual activity: Yes    Partners: Male    Birth control/protection: Condom  Lifestyle  . Physical activity:    Days per week: Not on file    Minutes per session: Not on file  . Stress: Not on file  Relationships  . Social connections:    Talks on phone: Not on file    Gets together: Not on file    Attends religious service: Not on file    Active member of club or organization: Not on file    Attends meetings of clubs or  organizations: Not on file    Relationship status: Not on file  . Intimate partner violence:    Fear of current or ex partner: Not on file    Emotionally abused: Not on file    Physically abused: Not on file    Forced sexual activity: Not on file  Other Topics Concern  . Not on file  Social History Narrative  . Not on file    Review of Systems  Constitutional: Negative.   HENT: Negative.   Eyes: Negative.   Respiratory: Negative.   Cardiovascular: Negative.   Gastrointestinal: Negative.   Genitourinary:       Pelvic pain    Skin: Negative.   Neurological: Negative.   Endo/Heme/Allergies: Negative.   Psychiatric/Behavioral: Negative.     PHYSICAL EXAMINATION:    BP 118/82 (BP Location: Right Arm, Patient Position: Sitting, Cuff Size: Large)   Pulse 78   Temp 98.3 F (36.8 C) (Oral)   Resp 14   Ht 5\' 4"  (1.626 m)   LMP 06/26/2018   BMI 37.25 kg/m     General appearance: alert, cooperative and appears stated age Abdomen: soft, non-tender; non distended, no masses,  no organomegaly  Pelvic: External genitalia:  no lesions              Urethra:  normal appearing urethra with no masses, tenderness or lesions              Bartholins and Skenes: normal                 Vagina: normal appearing vagina with normal color and discharge, no lesions              Cervix: no cervical motion tenderness, no lesions and IUD string 3-4 cm              Bimanual Exam:  Uterus:  normal size, contour, position, consistency, mobility, non-tender and anteverted              Adnexa: no mass, fullness, tenderness                We had a discussion as to getting an ultrasound to check the  location. She just wants the IUD pulled.   Repeat exam done, IUD pulled with ringed forceps.  Chaperone was present for exam.  ASSESSMENT Pain after IUD insertion, normal exam. Discussed giving it time for the cramping to resolve, getting an ultrasound to check position. The patient wanted it removed.      PLAN IUD removed She will use condoms for contraception   An After Visit Summary was printed and given to the patient.  ~15 minutes face to face time of which over 50% was spent in counseling.

## 2018-07-02 LAB — CHLAMYDIA/GONOCOCCUS/TRICHOMONAS, NAA
Chlamydia by NAA: NEGATIVE
GONOCOCCUS BY NAA: NEGATIVE
Trich vag by NAA: NEGATIVE

## 2018-07-08 ENCOUNTER — Encounter: Payer: Self-pay | Admitting: Family Medicine

## 2018-07-08 ENCOUNTER — Other Ambulatory Visit: Payer: Self-pay

## 2018-07-08 MED ORDER — LIRAGLUTIDE -WEIGHT MANAGEMENT 18 MG/3ML ~~LOC~~ SOPN: 3.0000 mL | PEN_INJECTOR | Freq: Every day | SUBCUTANEOUS | 3 refills | Status: DC

## 2018-07-18 ENCOUNTER — Telehealth: Payer: Self-pay

## 2018-07-18 NOTE — Telephone Encounter (Signed)
Brandi Navarro (Key: [id number])  Rx #: [id number]  Saxenda 18MG Ronny Bacon pen-injectors  Form [employer] Electronic Prior Authorization Form (2017 NCPDP)  Created  3 days ago  Sent to Plan  5 minutes ago  Plan Response  5 minutes ago  Submit Clinical Questions  less than a minute ago  Determination    Wait for Determination Please wait for [facility] 2017 NCPDP to return a determination.   Pending approval

## 2018-07-19 NOTE — Telephone Encounter (Signed)
PA denied. Appeal initiated.  

## 2018-07-25 ENCOUNTER — Encounter: Payer: Self-pay | Admitting: Family Medicine

## 2018-07-29 ENCOUNTER — Ambulatory Visit: Payer: 59 | Admitting: Obstetrics and Gynecology

## 2018-08-08 ENCOUNTER — Ambulatory Visit: Payer: Self-pay

## 2018-08-08 NOTE — Telephone Encounter (Signed)
Please advise 

## 2018-08-08 NOTE — Telephone Encounter (Signed)
[facility] called and spoke to [place], Technician about the call about the dosage on Saxenda. She says the prescription received is for 3 ml, which is 18 mg/day, and the recommended maintenance dosage is 0.3 ml, which is 3 mg/day. She asks is that the correct dosage or did Dr. Earlene Plater mean 0.3 ml? I advised there's no documentation as to the dosage the patient was receiving with the samples given to her, so this will be sent to Dr. Earlene Plater for clarification and someone from the office will call with the clarification.   Reason for Disposition . Pharmacy calling with prescription questions and triager unable to answer question  Protocols used: MEDICATION QUESTION CALL-A-AH

## 2018-08-08 NOTE — Telephone Encounter (Signed)
See note

## 2018-08-09 ENCOUNTER — Other Ambulatory Visit: Payer: Self-pay

## 2018-08-09 MED ORDER — LIRAGLUTIDE -WEIGHT MANAGEMENT 18 MG/3ML ~~LOC~~ SOPN: 3.0000 mg | PEN_INJECTOR | Freq: Every day | SUBCUTANEOUS | 3 refills | Status: DC

## 2018-08-09 NOTE — Telephone Encounter (Signed)
Please see pamphlet on your desk with dosing instructions. Okay to call pharmacy to clarify.

## 2018-08-09 NOTE — Telephone Encounter (Signed)
Resent script should be 3mg  daily not 41ml

## 2018-08-16 DIAGNOSIS — J0391 Acute recurrent tonsillitis, unspecified: Secondary | ICD-10-CM | POA: Diagnosis not present

## 2018-08-18 ENCOUNTER — Telehealth: Payer: Self-pay

## 2018-08-18 NOTE — Telephone Encounter (Signed)
Sinclair Grooms (KeyDenton Meek)  Rx #: [id number]  Saxenda 18MG Ronny Bacon pen-injectors  Form [employer] Electronic Prior Authorization Form (2017 NCPDP)  Created  2 days ago  Sent to Plan  5 minutes ago  Plan Response  5 minutes ago  Submit Clinical Questions  less than a minute ago  Determination

## 2018-09-12 DIAGNOSIS — J0391 Acute recurrent tonsillitis, unspecified: Secondary | ICD-10-CM | POA: Insufficient documentation

## 2018-09-16 ENCOUNTER — Ambulatory Visit: Payer: 59 | Admitting: Family Medicine

## 2018-10-04 ENCOUNTER — Telehealth: Payer: Self-pay | Admitting: Family Medicine

## 2018-10-04 DIAGNOSIS — I10 Essential (primary) hypertension: Secondary | ICD-10-CM

## 2018-10-04 NOTE — Telephone Encounter (Signed)
Please see message and advise 

## 2018-10-04 NOTE — Telephone Encounter (Signed)
Copied from CRM [id number]. Topic: Appointment Scheduling - Scheduling Inquiry for Clinic >> Oct 04, 2018  1:17 PM Wyonia Hough E wrote: Reason for CRM: Pt has an appt coming up and was asked to do a virtual appt but the Pt states it is for her BP and she doent ave a cuff at home. Pt wants to know if she can cancel the appt and have her medication metoprolol succinate (TOPROL-XL) 25 MG 24 hr tablet called into to the pharmacy or is there another option for her appt/ please advise

## 2018-10-05 MED ORDER — METOPROLOL SUCCINATE ER 25 MG PO TB24: 25.0000 mg | ORAL_TABLET | Freq: Every day | ORAL | 1 refills | Status: DC

## 2018-10-05 NOTE — Telephone Encounter (Addendum)
Called pt and left VM to call the office - needs to keep scheduled f/u for virtual visit.   Metoprolol has been refilled.

## 2018-10-05 NOTE — Telephone Encounter (Signed)
Appt is for weight med follow up. I prefer to check in with her, even without bp monitor. Okay to call in Metoprolol either way. No Saxenda Rx without visit - need documentation of efficacy for insurance coverage.

## 2018-10-06 NOTE — Telephone Encounter (Signed)
FYI   Patient will come by day of appointment for BP check in the car. She will come to office and call. I will go out and get vitals from her. I have sent patient my chart with phone number and instructions of where to park

## 2018-10-11 ENCOUNTER — Encounter: Payer: Self-pay | Admitting: Family Medicine

## 2018-10-11 ENCOUNTER — Other Ambulatory Visit: Payer: Self-pay

## 2018-10-11 ENCOUNTER — Ambulatory Visit (INDEPENDENT_AMBULATORY_CARE_PROVIDER_SITE_OTHER): Payer: 59 | Admitting: Family Medicine

## 2018-10-11 VITALS — BP 118/62 | HR 76 | Temp 98.6°F | Ht 64.0 in | Wt 237.0 lb

## 2018-10-11 DIAGNOSIS — G43109 Migraine with aura, not intractable, without status migrainosus: Secondary | ICD-10-CM

## 2018-10-11 DIAGNOSIS — Z7189 Other specified counseling: Secondary | ICD-10-CM | POA: Diagnosis not present

## 2018-10-11 DIAGNOSIS — I10 Essential (primary) hypertension: Secondary | ICD-10-CM

## 2018-10-11 NOTE — Progress Notes (Addendum)
Virtual Visit via Video   I connected with Brandi Navarro by a video enabled telemedicine application and verified that I am speaking with the correct person using two identifiers. Location patient: Home Location provider: [facility], Office Persons participating in the virtual visit: Lavella HammockOlivia Valeri, Wladyslaw Henrichs, DO   I discussed the limitations of evaluation and management by telemedicine and the availability of in person appointments. The patient expressed understanding and agreed to proceed.  Subjective:   HPI:   Migraine with aura and without status migrainosus, not intractable Migraines improved with Trokendi.   Morbid obesity (HCC)  Wt Readings from Last 3 Encounters:  10/11/18 237 lb (107.5 kg)  06/30/18 217 lb (98.4 kg)  06/17/18 239 lb 6.4 oz (108.6 kg)   Maintaining. Last weight incorrect.  Review: taking medications as instructed, no medication side effects noted, no TIAs, no chest pain on exertion, no dyspnea on exertion, no swelling of ankles. Smoker: No.   BP Readings from Last 3 Encounters:  10/11/18 118/62  07/01/18 118/82  06/30/18 106/68   Lab Results  Component Value Date   CREATININE 0.79 12/20/2017     Reviewed all precautions and expectations with prevention of Covid-19.   ROS: See pertinent positives and negatives per HPI.  Patient Active Problem List   Diagnosis Date Noted  . Morbid obesity (HCC) 03/22/2018  . 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 Migraine with aura and without status migrainosus, not intractable 12/19/2017  . PMDD (premenstrual dysphoric disorder) 12/19/2017  . Seasonal allergic rhinitis due to pollen 12/19/2017  . Essential hypertension 12/19/2017  . Marijuana user 12/19/2017  . Pure hypercholesterolemia 12/19/2017    Social History   Tobacco Use  . Smoking status: Former Smoker    Types: E-cigarettes  . Smokeless tobacco: Never Used  Substance Use Topics  . Alcohol use: Yes    Alcohol/week:  2.0 - 3.0 standard drinks    Types: 2 - 3 Standard drinks or equivalent per week   Current Outpatient Medications:  .  citalopram (CELEXA) 20 MG tablet, TAKE 1 TABLET BY MOUTH ONCE A DAY, Disp: 90 tablet, Rfl: 3 .  IBUPROFEN PO, Take by mouth., Disp: , Rfl:  .  Liraglutide -Weight Management (SAXENDA) 18 MG/3ML SOPN, Inject 3 mg into the skin daily., Disp: 5 pen, Rfl: 3 .  loratadine (CLARITIN) 10 MG tablet, Take 10 mg by mouth daily., Disp: , Rfl:  .  metoprolol succinate (TOPROL-XL) 25 MG 24 hr tablet, Take 1 tablet (25 mg total) by mouth daily., Disp: 90 tablet, Rfl: 1 .  Multiple Vitamins-Minerals (MULTIVITAMIN PO), Take by mouth daily., Disp: , Rfl:  .  Topiramate ER (TROKENDI XR) 50 MG CP24, Take 1 capsule by mouth daily., Disp: 90 capsule, Rfl: 2 .  valACYclovir (VALTREX) 500 MG tablet, *TAKE 2 TABLETS BY MOUTH TWICE DAILY AT ONSET OF OUTBREAK**, Disp: 45 tablet, Rfl: 6  No Known Allergies  Objective:   VITALS: Per patient if applicable, see vitals. GENERAL: Alert, appears well and in no acute distress. HEENT: Atraumatic, conjunctiva clear, no obvious abnormalities on inspection of external nose and ears. NECK: Normal movements of the head and neck. CARDIOPULMONARY: No increased WOB. Speaking in clear sentences. I:E ratio WNL.  MS: Moves all visible extremities without noticeable abnormality. PSYCH: Pleasant and cooperative, well-groomed. Speech normal rate and rhythm. Affect is appropriate. Insight and judgement are appropriate. Attention is focused, linear, and appropriate.  NEURO: CN grossly intact. Oriented as arrived to appointment on time  with no prompting. Moves both UE equally.  SKIN: No obvious lesions, wounds, erythema, or cyanosis noted on face or hands.  Assessment and Plan:   Laylana was seen today for follow-up.  Diagnoses and all orders for this visit:  Essential hypertension Comments: Stable. Continue.  Orders: -     metoprolol succinate (TOPROL-XL) 25 MG 24  hr tablet; Take 1 tablet (25 mg total) by mouth daily.  Morbid obesity (HCC) Comments: Reviewed healthy food choices and exercise.   Educated About Covid-19 Virus Infection  Migraine with aura and without status migrainosus, not intractable Comments: Stable. Continue.    . Reviewed expectations re: course of current medical issues. . Discussed self-management of symptoms. . Outlined signs and symptoms indicating need for more acute intervention. . Patient verbalized understanding and all questions were answered. Marland Kitchen Health Maintenance issues including appropriate healthy diet, exercise, and smoking avoidance were discussed with patient. . See orders for this visit as documented in the electronic medical record.  Helane Rima, DO  Records requested if needed. Time spent: 25 minutes, of which >50% was spent in obtaining information about her symptoms, reviewing her previous labs, evaluations, and treatments, counseling her about her condition (please see the discussed topics above), and developing a plan to further investigate it; she had a number of questions which I addressed.

## 2018-10-16 MED ORDER — METOPROLOL SUCCINATE ER 25 MG PO TB24: 25.0000 mg | ORAL_TABLET | Freq: Every day | ORAL | 2 refills | Status: DC

## 2018-11-04 IMAGING — US US THYROID
1 series · 14 of 25 positions shown · non-contrast
Comparison: None.

CLINICAL DATA: Thyromegaly

EXAM:
THYROID ULTRASOUND
TECHNIQUE: Ultrasound examination of the thyroid gland and adjacent soft
tissues was performed.

[Series 1: us thyroid · 0.04mm/px · 14 of 34 slices shown]
[im 1/34]
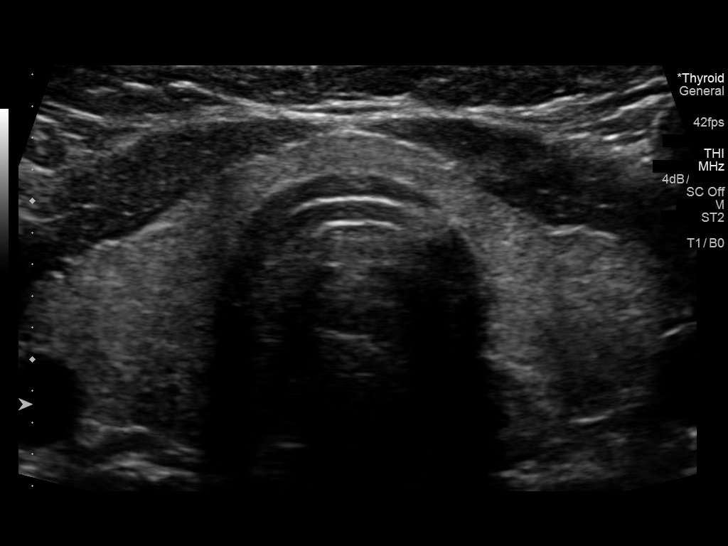
[im 3/34]
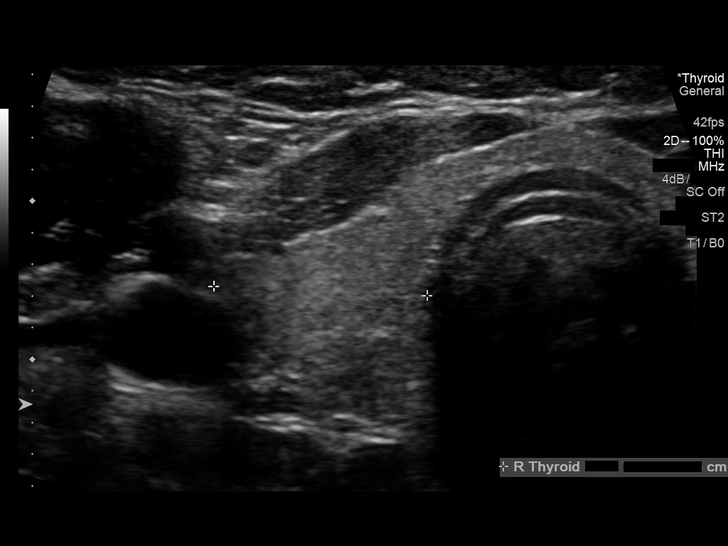
[im 6/34]
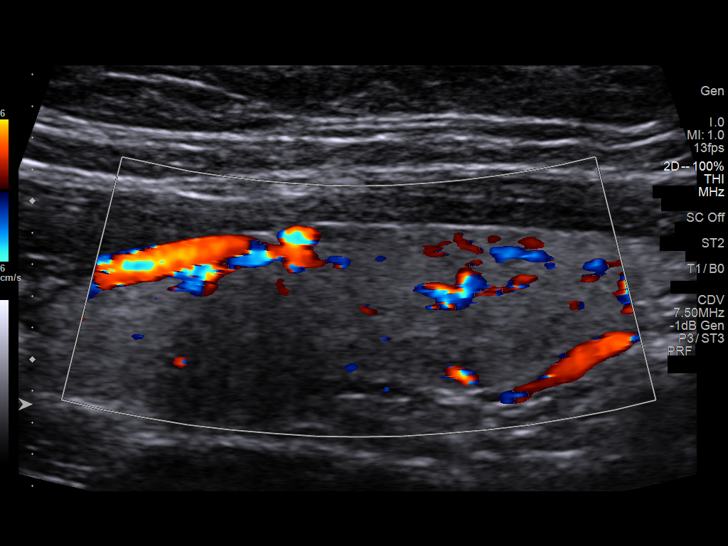
[im 9/34]
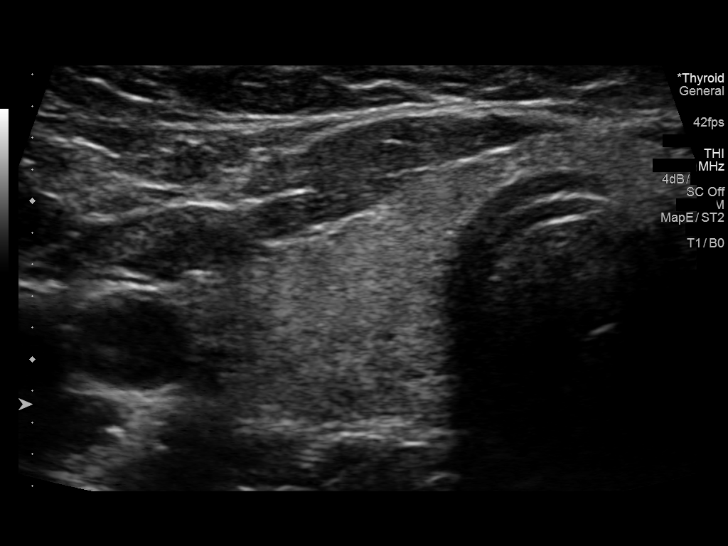
[im 12/34]
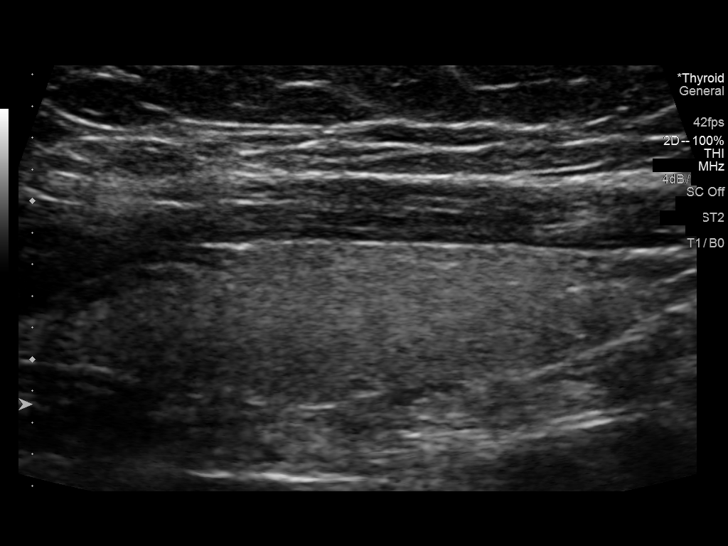
[im 13/34]
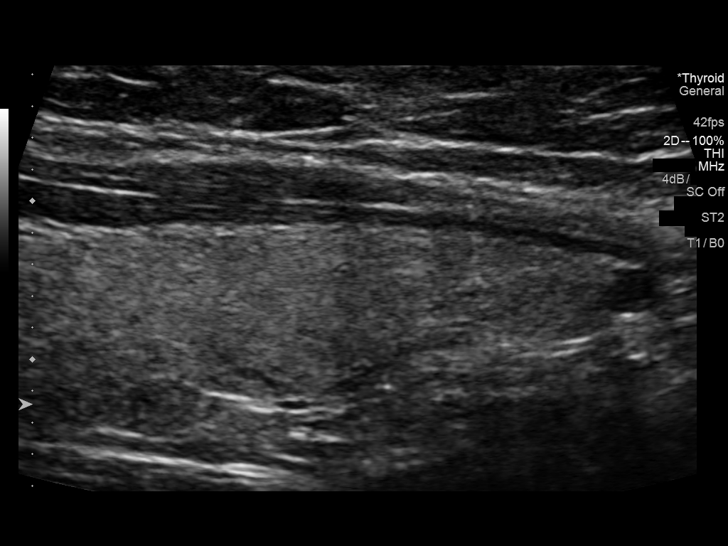
[im 16/34]
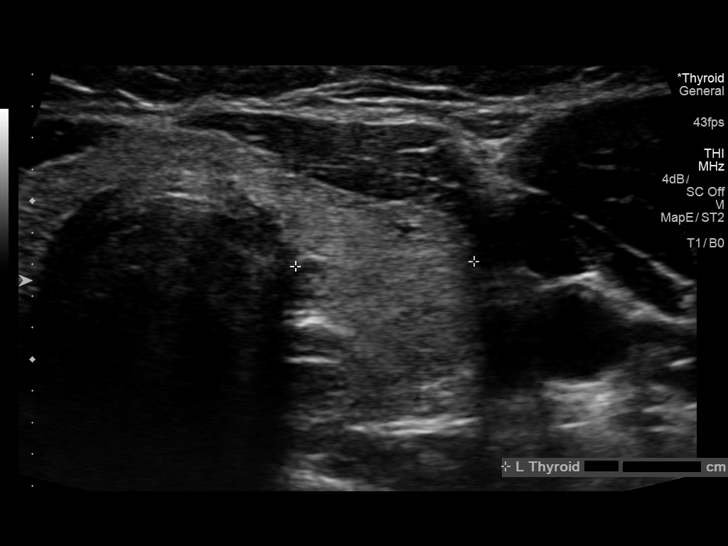
[im 18/34]
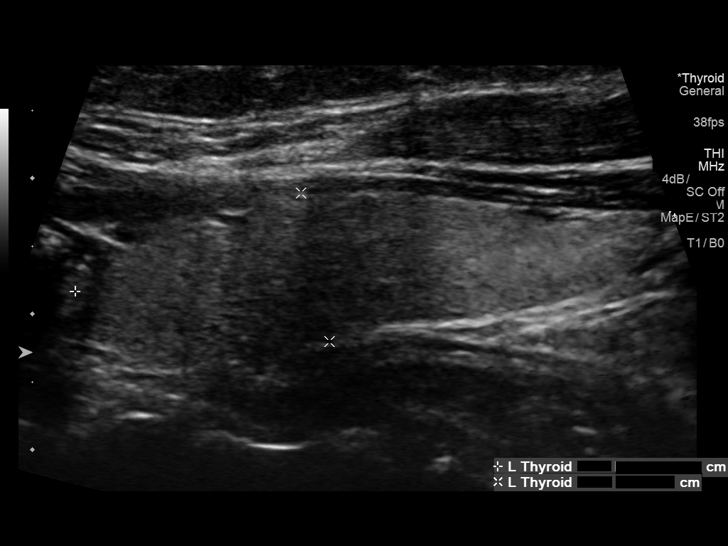
[im 21/34]
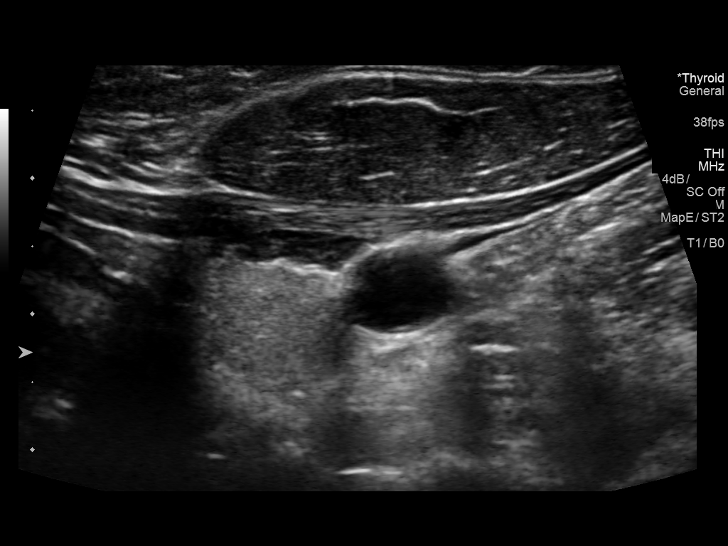
[im 23/34]
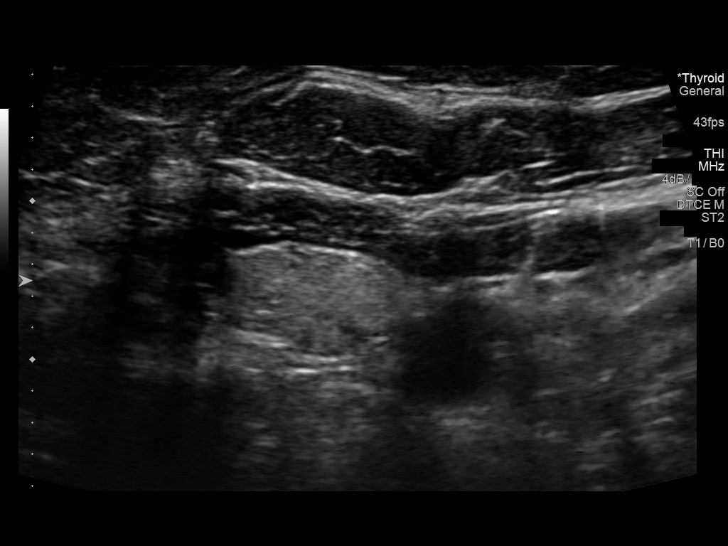
[im 25/34]
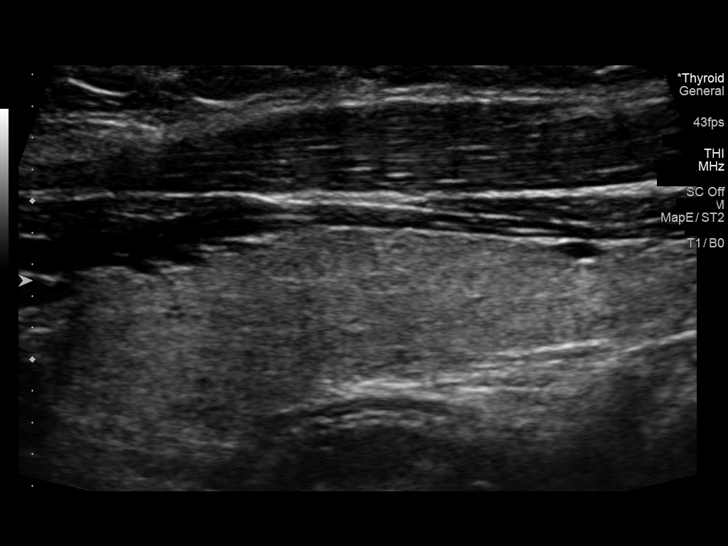
[im 28/34]
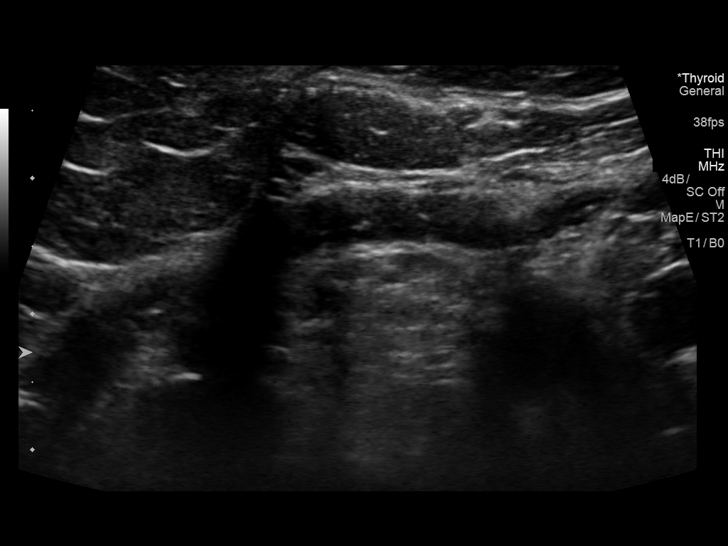
[im 31/34]
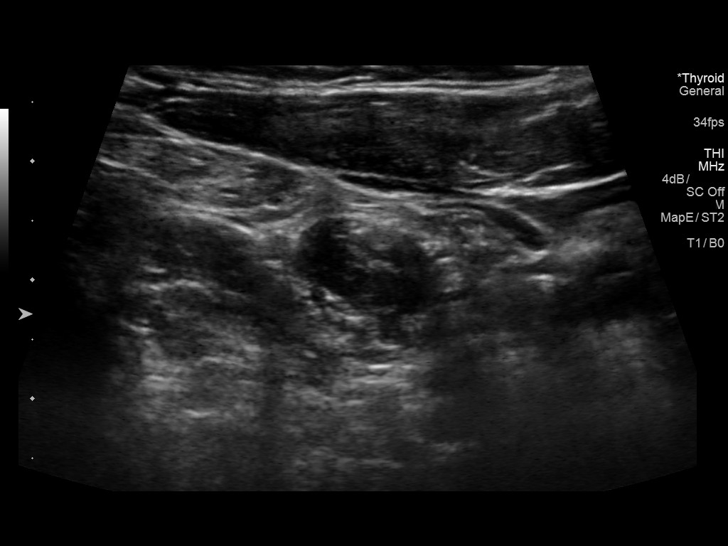
[im 34/34]
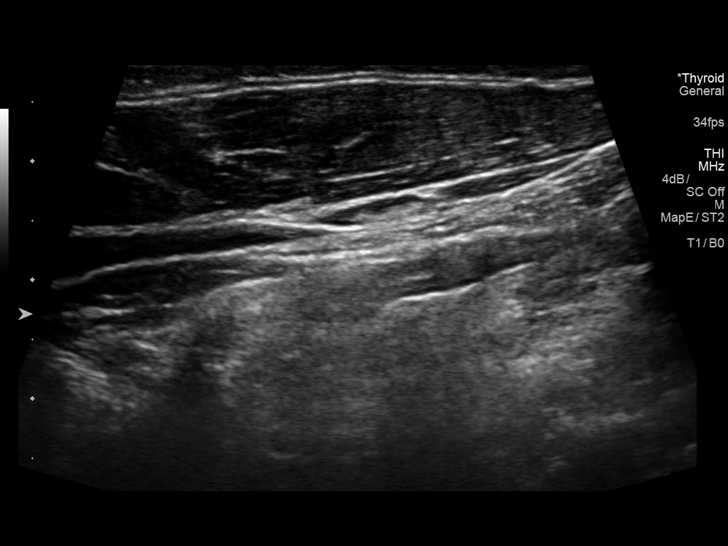

[14 of 25 positions shown; findings below may reference images not displayed]

FINDINGS: Parenchymal Echotexture: Normal

Isthmus: 4 mm

Right lobe: 4.2 x 1.1 x 1.4 cm

Left lobe: 4.5 x 1.1 x 1.1 cm

_________________________________________________________

Estimated total number of nodules >/= 1 cm: 0

Number of spongiform nodules >/=  2 cm not described below (TR1): 0

Number of mixed cystic and solid nodules >/= 1.5 cm not described
below (TR2): 0

_________________________________________________________

No discrete nodules are seen within the thyroid gland.
IMPRESSION: Normal thyroid ultrasound

The above is in keeping with the ACR TI-RADS recommendations - [HOSPITAL] 2178;[DATE].

## 2018-12-14 ENCOUNTER — Encounter: Payer: Self-pay | Admitting: Family Medicine

## 2018-12-16 ENCOUNTER — Telehealth: Payer: Self-pay

## 2018-12-16 NOTE — Telephone Encounter (Signed)
Your PA has been faxed to the plan as a paper copy. Please contact the plan directly if you haven't received a determination in a typical timeframe.  You will be notified of the determination via fax. 

## 2019-01-11 ENCOUNTER — Encounter: Payer: Self-pay | Admitting: Certified Nurse Midwife

## 2019-01-11 ENCOUNTER — Ambulatory Visit (INDEPENDENT_AMBULATORY_CARE_PROVIDER_SITE_OTHER): Payer: 59 | Admitting: Certified Nurse Midwife

## 2019-01-11 ENCOUNTER — Other Ambulatory Visit: Payer: Self-pay

## 2019-01-11 ENCOUNTER — Other Ambulatory Visit (HOSPITAL_COMMUNITY)
Admission: RE | Admit: 2019-01-11 | Discharge: 2019-01-11 | Disposition: A | Discharge status: Home / Self Care | Payer: 59 | Source: Ambulatory Visit | Attending: Certified Nurse Midwife | Admitting: Certified Nurse Midwife

## 2019-01-11 VITALS — BP 120/70 | HR 68 | Temp 97.4°F | Resp 16 | Ht 64.25 in | Wt 255.0 lb

## 2019-01-11 DIAGNOSIS — Z8679 Personal history of other diseases of the circulatory system: Secondary | ICD-10-CM

## 2019-01-11 DIAGNOSIS — Z124 Encounter for screening for malignant neoplasm of cervix: Secondary | ICD-10-CM | POA: Diagnosis not present

## 2019-01-11 DIAGNOSIS — F4323 Adjustment disorder with mixed anxiety and depressed mood: Secondary | ICD-10-CM

## 2019-01-11 DIAGNOSIS — Z8619 Personal history of other infectious and parasitic diseases: Secondary | ICD-10-CM

## 2019-01-11 DIAGNOSIS — Z113 Encounter for screening for infections with a predominantly sexual mode of transmission: Secondary | ICD-10-CM | POA: Insufficient documentation

## 2019-01-11 DIAGNOSIS — Z01419 Encounter for gynecological examination (general) (routine) without abnormal findings: Secondary | ICD-10-CM

## 2019-01-11 MED ORDER — VALACYCLOVIR HCL 500 MG PO TABS: ORAL_TABLET | ORAL | 4 refills | Status: DC

## 2019-01-11 MED ORDER — CITALOPRAM HYDROBROMIDE 20 MG PO TABS: ORAL_TABLET | ORAL | 3 refills | Status: DC

## 2019-01-11 NOTE — Progress Notes (Signed)
33 y.o. G0P0000 Single  Caucasian Fe here for annual exam. Periods heavier and more cramping off OCP. Has missed a couple of periods with negative UPT. Contraception condoms working well. Sexually active, partner change. Desires STD screening. No HSV outbreaks. Needs medication update. Celexa working well for mood changes.  Sees PCP for aex,labs and hypertension management. All stable at present.No other health issues today.  Patient's last menstrual period was 12/29/2018 (exact date).          Sexually active: Yes.    The current method of family planning is condoms sometimes.    Exercising: No.  exercise Smoker:  no  Review of Systems  Constitutional: Negative.   HENT: Negative.   Eyes: Negative.   Respiratory: Negative.   Cardiovascular: Negative.   Gastrointestinal: Negative.   Genitourinary: Negative.   Musculoskeletal: Negative.   Skin: Negative.   Neurological: Negative.   Endo/Heme/Allergies: Negative.   Psychiatric/Behavioral: Negative.     Health Maintenance: Pap:  12-10-16 neg History of Abnormal Pap: no MMG:  11-28-13 rt breast u/s neg Self Breast exams: occ Colonoscopy:  none BMD:   none TDaP:  2018 Shingles: no Pneumonia: no Hep C and HIV: HIV neg 2016 Labs: yes   reports that she has quit smoking. Her smoking use included e-cigarettes. She has never used smokeless tobacco. She reports current alcohol use of about 2.0 - 3.0 standard drinks of alcohol per week. She reports current drug use. Drug: Marijuana.  Past Medical History:  Diagnosis Date  . Asthma   . HSV2   . Migraines with aura     History reviewed. No pertinent surgical history.  Current Outpatient Medications  Medication Sig Dispense Refill  . citalopram (CELEXA) 20 MG tablet TAKE 1 TABLET BY MOUTH ONCE A DAY 90 tablet 3  . IBUPROFEN PO Take by mouth.    . loratadine (CLARITIN) 10 MG tablet Take 10 mg by mouth daily.    . metoprolol succinate (TOPROL-XL) 25 MG 24 hr tablet Take 1 tablet (25 mg  total) by mouth daily. 90 tablet 2  . Multiple Vitamins-Minerals (MULTIVITAMIN PO) Take by mouth daily.    . Topiramate ER (TROKENDI XR) 50 MG CP24 Take 1 capsule by mouth daily. 90 capsule 2  . valACYclovir (VALTREX) 500 MG tablet *TAKE 2 TABLETS BY MOUTH TWICE DAILY AT ONSET OF OUTBREAK** (Patient taking differently: 500 mg daily. ) 45 tablet 6   No current facility-administered medications for this visit.     Family History  Problem Relation Age of Onset  . Cancer Other   . Diabetes Maternal Grandfather   . Hypertension Maternal Grandfather   . Anemia Mother   . Anemia Sister   . Anemia Maternal Grandmother     ROS:  Pertinent items are noted in HPI.  Otherwise, a comprehensive ROS was negative.  Exam:   BP 120/70   Pulse 68   Temp (!) 97.4 F (36.3 C) (Skin)   Resp 16   Ht 5' 4.25" (1.632 m)   Wt 255 lb (115.7 kg)   LMP 12/29/2018 (Exact Date)   BMI 43.43 kg/m  Height: 5' 4.25" (163.2 cm) Ht Readings from Last 3 Encounters:  01/11/19 5' 4.25" (1.632 m)  10/11/18 5\' 4"  (1.626 m)  07/01/18 5\' 4"  (1.626 m)    General appearance: alert, cooperative and appears stated age Head: Normocephalic, without obvious abnormality, atraumatic Neck: no adenopathy, supple, symmetrical, trachea midline and thyroid normal to inspection and palpation Lungs: clear to auscultation bilaterally Breasts:  normal appearance, no masses or tenderness, No nipple retraction or dimpling, No nipple discharge or bleeding, No axillary or supraclavicular adenopathy Heart: regular rate and rhythm Abdomen: soft, non-tender; no masses,  no organomegaly Extremities: extremities normal, atraumatic, no cyanosis or edema Skin: Skin color, texture, turgor normal. No rashes or lesions Lymph nodes: Cervical, supraclavicular, and axillary nodes normal. No abnormal inguinal nodes palpated Neurologic: Grossly normal   Pelvic: External genitalia:  no lesions              Urethra:  normal appearing urethra with  no masses, tenderness or lesions              Bartholin's and Skene's: normal                 Vagina: normal appearing vagina with normal color and discharge, no lesions              Cervix: anteverted, no lesions and nulliparous appearance              Pap taken: Yes.   Bimanual Exam:  Uterus:  normal size, contour, position, consistency, mobility, non-tender and anteverted              Adnexa: normal adnexa and no mass, fullness, tenderness               Rectovaginal: Confirms               Anus:  normal sphincter tone, no lesions  Chaperone present: yes  A:  Well Woman with normal exam  Contraception Condoms consistent  Hypertension on medication with PCP management  Obesity with weight gain  Celexa working well for anxiety/depression desires continuance  HSV 2 history needs Rx update  STD screening    P:   Reviewed health and wellness pertinent to exam  Stressed consistent use for best protection  Continue follow up with PCP as indicated  Discussed weight loss to help with regular cycles and health prevention. Encouraged meal prep and exercise daily.  Discussed importance of only using as prescribed  Rx Celexa see order with instructions  Rx Valtrex see order with instructions  Labs: HIV,RPR,Hep C, GC,Chlamydia, Affirm  Pap smear: yes   counseled on breast self exam, STD prevention, HIV risk factors and prevention, feminine hygiene, adequate intake of calcium and vitamin D, diet and exercise  return annually or prn  An After Visit Summary was printed and given to the patient.

## 2019-01-12 ENCOUNTER — Ambulatory Visit (INDEPENDENT_AMBULATORY_CARE_PROVIDER_SITE_OTHER): Payer: 59 | Admitting: Family Medicine

## 2019-01-12 ENCOUNTER — Telehealth: Payer: Self-pay | Admitting: *Deleted

## 2019-01-12 ENCOUNTER — Encounter: Payer: Self-pay | Admitting: Family Medicine

## 2019-01-12 ENCOUNTER — Other Ambulatory Visit: Payer: Self-pay

## 2019-01-12 DIAGNOSIS — R059 Cough, unspecified: Secondary | ICD-10-CM

## 2019-01-12 DIAGNOSIS — Z20822 Contact with and (suspected) exposure to covid-19: Secondary | ICD-10-CM

## 2019-01-12 DIAGNOSIS — Z20828 Contact with and (suspected) exposure to other viral communicable diseases: Secondary | ICD-10-CM

## 2019-01-12 DIAGNOSIS — R05 Cough: Secondary | ICD-10-CM

## 2019-01-12 DIAGNOSIS — J029 Acute pharyngitis, unspecified: Secondary | ICD-10-CM | POA: Diagnosis not present

## 2019-01-12 LAB — VAGINITIS/VAGINOSIS, DNA PROBE
Candida Species: NEGATIVE
Gardnerella vaginalis: POSITIVE — AB
Trichomonas vaginosis: NEGATIVE

## 2019-01-12 LAB — HEPATITIS C ANTIBODY: Hep C Virus Ab: <0.1 s/co ratio (ref 0.0–0.9)

## 2019-01-12 LAB — RPR: RPR Ser Ql: NONREACTIVE

## 2019-01-12 LAB — HIV ANTIBODY (ROUTINE TESTING W REFLEX): HIV Screen 4th Generation wRfx: NONREACTIVE

## 2019-01-12 MED ORDER — AMOXICILLIN 500 MG PO CAPS: 500.0000 mg | ORAL_CAPSULE | Freq: Three times a day (TID) | ORAL | 0 refills | Status: DC

## 2019-01-12 NOTE — Patient Instructions (Signed)
Self Isolation/Home Quarantine: -see the CDC site for information:   RunningShows.co.za.html   -STAY HOME except for to seek medical care -stay in your own room away from others in your house and use a separate bathroom if possible -Wash hands frequently, wear a mask if you leave your room and interact as little as possible with others -seek medical care if worsening - call our office for a visit or call ahead if going elsewhere -seek emergency care if very sick or severe symptoms - call 911 -isolate for at least 10 days from the onset of symptoms PLUS 3 days of no fever PLUS 3 days of improving symptoms   Follow up: in 4-5 days   Take the antibiotic if worsening sore throat or if coronavirus test is negative.   Novel Coronavirus Testing: I sent an order for coronavirus testing. No Appointment is needed.  Testing Sites:    [facility] Location:                            [address] [place] (old [facility]) Hours:                                 8a-3:45p, M-F  [facility] Location:                           [address] [place], [place] [address]                                                              [place] ([place]) Hours:                                 8a-3:45p, M-F  [facility] Location:                            [facility], [place] (across from [place]) Hours:                                 8a-3:45p, M-F Positive test.These tests are not 100% perfect, but if you tested positive for COVID-19, this confirms that you have contracted the SARS-CoV-2 virus. STAY HOME to complete full Quarantine per CDC guidelines.  Negative test.These tests are not 100% perfect but if you tested negative for COVID-19, this indicates that you may not have contracted the SARS-CoV-2 virus. Follow your doctor's  recommendations and the CDC guidelines.

## 2019-01-12 NOTE — Progress Notes (Addendum)
Virtual Visit via Video Note  I connected with Brandi Navarro  on 01/12/19 at 10:15 AM EDT by a video enabled telemedicine application and verified that I am speaking with the correct person using two identifiers.  Location patient: home Location provider:work or home office Persons participating in the virtual visit: patient, provider  I discussed the limitations of evaluation and management by telemedicine and the availability of in person appointments. The patient expressed understanding and agreed to proceed.   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  Past Medical History:  Diagnosis Date  . Asthma   . HSV2   . Migraines with aura     History reviewed. No pertinent surgical history.  Family History  Problem Relation Age of Onset  . Cancer Other   . Diabetes Maternal Grandfather   . Hypertension Maternal Grandfather   . Anemia Mother   . Anemia Sister   . Anemia Maternal Grandmother     SOCIAL HX: see hpi   Current Outpatient Medications:  .  citalopram (CELEXA) 20 MG tablet, TAKE 1 TABLET BY MOUTH ONCE A DAY,   avoid Ibuprofen with this medication, may cause GI bleeding, Disp: 90 tablet, Rfl: 3 .  IBUPROFEN PO, Take by mouth.,  Disp: , Rfl:  .  loratadine (CLARITIN) 10 MG tablet, Take 10 mg by mouth daily., Disp: , Rfl:  .  metoprolol succinate (TOPROL-XL) 25 MG 24 hr tablet, Take 1 tablet (25 mg total) by mouth daily., Disp: 90 tablet, Rfl: 2 .  Multiple Vitamins-Minerals (MULTIVITAMIN PO), Take by mouth daily., Disp: , Rfl:  .  Topiramate ER (TROKENDI XR) 50 MG CP24, Take 1 capsule by mouth daily., Disp: 90 capsule, Rfl: 2 .  valACYclovir (VALTREX) 500 MG tablet, One daily for suppression, increase to two daily at outbreak for 3 days, Disp: 90 tablet, Rfl: 4  EXAM:  VITALS per patient if applicable: T 97.3  GENERAL: alert, oriented, appears well and in no acute distress  HEENT: atraumatic, conjunttiva clear, no obvious abnormalities on inspection of external nose and ears, 1+ tonsillar enlargement with exudate, moist mucus membranes, PND  NECK: normal movements of the head and neck, tender ant cervical LAD on pt self exam  LUNGS: on inspection no signs of respiratory distress, breathing rate appears normal, no obvious gross SOB, gasping or wheezing  CV: no obvious cyanosis  MS: moves all visible extremities without noticeable abnormality  PSYCH/NEURO: pleasant and cooperative, no obvious depression or anxiety, speech and thought processing grossly intact  ASSESSMENT AND PLAN:  Discussed the following assessment and plan:  Cough - Plan: Novel Coronavirus, NAA ([facility])  Sore throat   Pharyngitis, unspecified etiology  -COVID19 test order placed and [facility] patient [facility] testing location and time of operation/test turn  around. -advised home isolation per CDC protocol -given exam findings and her history of strep concern for strep or co-infection as well. Discussed risks/beneftis tx with pt and opted for amox 500mg  bid x10 days to complete if sore throat worsens or if COVID test neg. Symptomatic care o/w. -return and emergency precautions discussed. -follow up  Monday or Tues   I discussed the assessment and treatment plan with the patient. The patient was provided an opportunity to ask questions and all were answered. The patient agreed with the plan and demonstrated an understanding of the instructions.   The patient was advised to call back or seek an in-person evaluation if the symptoms worsen or if the condition fails to improve as anticipated.   Follow up instructions: Advised assistant Ronnald CollumJo Anne to help patient arrange the following: -follow up with PCP or me early next week  Patient Instructions  Self Isolation/Home Quarantine: -see the CDC site for information:   DiscoHelp.sihttps://www.cdc.gov/coronavirus/2019-ncov/if-you-are-sick/steps-when-sick.html   -STAY HOME except for to seek medical care -stay in your own room away from others in your house and use a separate bathroom if possible -Wash hands frequently, wear a mask if you leave your room and interact as little as possible with others -seek medical care if worsening - call our office for a visit or call ahead if going elsewhere -seek emergency care if very sick or severe symptoms - call 911 -isolate for at least 10 days from the onset of symptoms PLUS 3 days of no fever PLUS 3 days of improving symptoms   Follow up: in 4-5 days   Take the antibiotic if worsening sore throat or if coronavirus test is negative.   Novel Coronavirus Testing: I sent an order for coronavirus testing. No Appointment is needed.  Testing Sites:    [facility] Location:                            [address], [place]                                              [facility] Hours:                                 8a-3:45p, M-F  [facility] Location:                           [address], [place], [place] [id number]                                                              [address] [facility] Hours:                                 8a-3:45p,  M-F  [facility] Location:                            [facility] (  across from [place]) Hours:                                 8a-3:45p, M-F Positive test.These tests are not 100% perfect, but if you tested positive for COVID-19, this confirms that you have contracted the SARS-CoV-2 virus. STAY HOME to complete full Quarantine per CDC guidelines.  Negative test.These tests are not 100% perfect but if you tested negative for COVID-19, this indicates that you may not have contracted the SARS-CoV-2 virus. Follow your doctor's recommendations and the CDC guidelines.           Lucretia Kern, DO

## 2019-01-12 NOTE — Telephone Encounter (Signed)
I called the pt and advised she call Dr Alcario Drought office for an appt as I cannot schedule for her office.

## 2019-01-12 NOTE — Telephone Encounter (Signed)
-----   Message from Lucretia Kern, DO sent at 01/12/2019 11:12 AM EDT ----- -follow up with PCP or me early next week

## 2019-01-13 ENCOUNTER — Telehealth: Payer: Self-pay | Admitting: *Deleted

## 2019-01-13 ENCOUNTER — Telehealth: Payer: Self-pay

## 2019-01-13 ENCOUNTER — Telehealth: Payer: Self-pay | Admitting: Certified Nurse Midwife

## 2019-01-13 LAB — CYTOLOGY - PAP
Adequacy: ABSENT
Chlamydia: NEGATIVE
Diagnosis: NEGATIVE
HPV: NOT DETECTED
Neisseria Gonorrhea: NEGATIVE
Trichomonas: NEGATIVE

## 2019-01-13 MED ORDER — METRONIDAZOLE 500 MG PO TABS: 500.0000 mg | ORAL_TABLET | Freq: Two times a day (BID) | ORAL | 0 refills | Status: DC

## 2019-01-13 MED ORDER — METRONIDAZOLE 0.75 % VA GEL: 1.0000 | Freq: Two times a day (BID) | VAGINAL | 0 refills | Status: AC

## 2019-01-13 NOTE — Telephone Encounter (Signed)
Pharmacist from [facility] called to verify directions on Metrogel. Directions read 1 applicatorful bid x 5 days. If patient uses twice a day she will need 2 tubes of Metrogel. Verified directions per telephone note to patient date 01-13-19 and with Karmen Bongo, RN. Advised pharmacy to give patient 2 tubes of Metrogel.

## 2019-01-13 NOTE — Telephone Encounter (Signed)
Patient asking to speak with nurse about switching prescriptions because of cost.

## 2019-01-13 NOTE — Telephone Encounter (Signed)
Call to patient. All results reviewed with patient and she verbalized understanding. Patient states she was started on amoxicillin yesterday for possible strept. Reviewed up to date- no interactions between flagyl and amoxicillin. Reviewed with Melvia Heaps, CNM, patient can try metrogel twice a day for 5 days if willing to. Returned call to patient and she states she is okay to use metrogel. Instructions on use reviewed with patient and she verbalized understanding. Metrogel #70g, 0RF sent to [facility] per patient request.   Routing to provider and will close encounter.

## 2019-01-13 NOTE — Telephone Encounter (Signed)
-----   Message from Regina Eck, CNM sent at 01/13/2019  7:16 AM EDT ----- Notify patient her vaginal screen was positive for BV only, yeast and trichomonas negative. She will need Rx Flagyl 500 mg bid x 7 avoid ETOH during treatment HIV,RPR and HepC are negative Pap pending

## 2019-01-13 NOTE — Telephone Encounter (Signed)
Patient called stating she could not afford Metrogel ($100). Can she try taking Flagyl orally instead? Will discuss with provider and call her back.

## 2019-01-13 NOTE — Telephone Encounter (Signed)
Per [name], CNM, advised patient to complete the Amoxicillin she is currently taking. Do not pick up Metrogel. We will call in Flagyl 500mg  #14, 1 po bid and she should start this after completing Amoxicillin--patient is asymptomatic for BV.  She knows to call if any questions or problems.

## 2019-01-17 LAB — NOVEL CORONAVIRUS, NAA: SARS-CoV-2, NAA: NOT DETECTED

## 2019-06-05 ENCOUNTER — Encounter: Payer: Self-pay | Admitting: Certified Nurse Midwife

## 2019-06-05 ENCOUNTER — Ambulatory Visit: Payer: 59 | Admitting: Certified Nurse Midwife

## 2019-06-05 ENCOUNTER — Other Ambulatory Visit: Payer: Self-pay

## 2019-06-05 VITALS — BP 136/90 | HR 70 | Temp 97.1°F | Resp 16 | Wt 258.0 lb

## 2019-06-05 DIAGNOSIS — N390 Urinary tract infection, site not specified: Secondary | ICD-10-CM

## 2019-06-05 DIAGNOSIS — R319 Hematuria, unspecified: Secondary | ICD-10-CM

## 2019-06-05 LAB — POCT URINALYSIS DIPSTICK
Bilirubin, UA: NEGATIVE
Glucose, UA: NEGATIVE
Ketones, UA: NEGATIVE
Nitrite, UA: NEGATIVE
Protein, UA: NEGATIVE
Urobilinogen, UA: NEGATIVE E.U./dL — AB
pH, UA: 5 (ref 5.0–8.0)

## 2019-06-05 MED ORDER — NITROFURANTOIN MONOHYD MACRO 100 MG PO CAPS: 100.0000 mg | ORAL_CAPSULE | Freq: Two times a day (BID) | ORAL | 0 refills | Status: DC

## 2019-06-05 NOTE — Progress Notes (Signed)
33 y.o. Single Caucasian female [id number] here with complaint of UTI, with onset  on 4 days. Patient complaining of urinary frequency/urgency/ and pain with urination. Patient denies fever, chills, nausea or back pain. No new personal products. Patient feels related to sexual activity. Denies any vaginal symptoms, some vaginal odor.  Contraception is condoms sometimes.  Patient now drinking adequate water intake. Previous alcohol, soda use. New partner request STD screening vaginal only. No other health issues today.  Review of Systems  Constitutional: Negative.   HENT: Negative.   Eyes: Negative.   Respiratory: Negative.   Cardiovascular: Negative.   Gastrointestinal: Negative.   Genitourinary: Positive for dysuria, frequency and urgency.  Musculoskeletal: Negative.   Skin: Negative.   Neurological: Negative.   Endo/Heme/Allergies: Negative.   Psychiatric/Behavioral: Negative.     O: Healthy female WDWN Affect: Normal, orientation x 3 Skin : warm and dry CVAT: negative bilateral Abdomen: positive for suprapubic tenderness  Pelvic exam: External genital area: normal, no lesions Bladder,Urethra tender, Urethral meatus: tender, slightly red Vagina: normal white vaginal discharge appearance,  Affirm, GC/Chlamydia lab taken Cervix: normal, non tender Uterus:normal,non tender Adnexa: normal non tender, no fullness or masses  Poct urine-WBC 1+,RBC tr A: UTI ? Post coital STD screening new partner Normal pelvic exam Contraception condoms  P: Reviewed findings of UTI and need for treatment. MV:[id number] see order with instructions BMW:UXLKG  culture Reviewed warning signs and symptoms of UTI and need to advise if occurring. Encouraged to limit soda, tea, and coffee, alcohol and be sure to increase water intake. Labs: affirm, Gc/Chlamydia declines serum screening Will treat if indicated.   RV prn

## 2019-06-05 NOTE — Patient Instructions (Signed)
Urinary Tract Infection, Adult A urinary tract infection (UTI) is an infection of any part of the urinary tract. The urinary tract includes:  The kidneys.  The ureters.  The bladder.  The urethra. These organs make, store, and get rid of pee (urine) in the body. What are the causes? This is caused by germs (bacteria) in your genital area. These germs grow and cause swelling (inflammation) of your urinary tract. What increases the risk? You are more likely to develop this condition if:  You have a small, thin tube (catheter) to drain pee.  You cannot control when you pee or poop (incontinence).  You are female, and: ? You use these methods to prevent pregnancy: ? A medicine that kills sperm (spermicide). ? A device that blocks sperm (diaphragm). ? You have low levels of a female hormone (estrogen). ? You are pregnant.  You have genes that add to your risk.  You are sexually active.  You take antibiotic medicines.  You have trouble peeing because of: ? A prostate that is bigger than normal, if you are female. ? A blockage in the part of your body that drains pee from the bladder (urethra). ? A kidney stone. ? A nerve condition that affects your bladder (neurogenic bladder). ? Not getting enough to drink. ? Not peeing often enough.  You have other conditions, such as: ? Diabetes. ? A weak disease-fighting system (immune system). ? Sickle cell disease. ? Gout. ? Injury of the spine. What are the signs or symptoms? Symptoms of this condition include:  Needing to pee right away (urgently).  Peeing often.  Peeing small amounts often.  Pain or burning when peeing.  Blood in the pee.  Pee that smells bad or not like normal.  Trouble peeing.  Pee that is cloudy.  Fluid coming from the vagina, if you are female.  Pain in the belly or lower back. Other symptoms include:  Throwing up (vomiting).  No urge to eat.  Feeling mixed up (confused).  Being tired  and grouchy (irritable).  A fever.  Watery poop (diarrhea). How is this treated? This condition may be treated with:  Antibiotic medicine.  Other medicines.  Drinking enough water. Follow these instructions at home:  Medicines  Take over-the-counter and prescription medicines only as told by your doctor.  If you were prescribed an antibiotic medicine, take it as told by your doctor. Do not stop taking it even if you start to feel better. General instructions  Make sure you: ? Pee until your bladder is empty. ? Do not hold pee for a long time. ? Empty your bladder after sex. ? Wipe from front to back after pooping if you are a female. Use each tissue one time when you wipe.  Drink enough fluid to keep your pee pale yellow.  Keep all follow-up visits as told by your doctor. This is important. Contact a doctor if:  You do not get better after 1-2 days.  Your symptoms go away and then come back. Get help right away if:  You have very bad back pain.  You have very bad pain in your lower belly.  You have a fever.  You are sick to your stomach (nauseous).  You are throwing up. Summary  A urinary tract infection (UTI) is an infection of any part of the urinary tract.  This condition is caused by germs in your genital area.  There are many risk factors for a UTI. These include having a small, thin   tube to drain pee and not being able to control when you pee or poop.  Treatment includes antibiotic medicines for germs.  Drink enough fluid to keep your pee pale yellow. This information is not intended to replace advice given to you by your health care provider. Make sure you discuss any questions you have with your health care provider. Document Released: 12/02/2007 Document Revised: 06/02/2018 Document Reviewed: 12/23/2017 Elsevier Patient Education  2020 Elsevier Inc.  

## 2019-06-05 NOTE — Addendum Note (Signed)
Addended by: Regina Eck on: 06/05/2019 04:09 PM   Modules accepted: Orders

## 2019-06-06 ENCOUNTER — Other Ambulatory Visit: Payer: Self-pay

## 2019-06-06 LAB — URINALYSIS, MICROSCOPIC ONLY: Casts: NONE SEEN /lpf

## 2019-06-06 LAB — VAGINITIS/VAGINOSIS, DNA PROBE
Candida Species: NEGATIVE
Gardnerella vaginalis: POSITIVE — AB
Trichomonas vaginosis: NEGATIVE

## 2019-06-06 MED ORDER — METRONIDAZOLE 500 MG PO TABS: 500.0000 mg | ORAL_TABLET | Freq: Two times a day (BID) | ORAL | 0 refills | Status: DC

## 2019-06-06 NOTE — Telephone Encounter (Addendum)
Notes recorded by Regina Eck, CNM on 06/06/2019 at 12:53 PM EST  Notify patient her urine micro did show WBC and RBC and some bacteria. Culture pending  Vaginal screen was negative for yeast and trichomonas. Positive for BV. Will need Rx Flagyl 500 mg bid x 7 once she has completed Macrobid and no concerns for pregnancy. No contraception. NO alcohol use during treatment.  Left message for callback.

## 2019-06-06 NOTE — Telephone Encounter (Signed)
Patient returning call to Joy.  

## 2019-06-06 NOTE — Telephone Encounter (Signed)
Patient notified of results. rx sent to pharmacy. 

## 2019-06-07 LAB — URINE CULTURE

## 2019-06-07 LAB — GC/CHLAMYDIA PROBE AMP
Chlamydia trachomatis, NAA: NEGATIVE
Neisseria Gonorrhoeae by PCR: NEGATIVE

## 2019-06-07 NOTE — Telephone Encounter (Signed)
MyChart message to patient.  ° °Encounter closed.  °

## 2019-06-17 ENCOUNTER — Other Ambulatory Visit: Payer: Self-pay | Admitting: Family Medicine

## 2019-06-17 DIAGNOSIS — G43109 Migraine with aura, not intractable, without status migrainosus: Secondary | ICD-10-CM

## 2019-06-21 ENCOUNTER — Other Ambulatory Visit: Payer: Self-pay | Admitting: Family Medicine

## 2019-06-21 DIAGNOSIS — G43109 Migraine with aura, not intractable, without status migrainosus: Secondary | ICD-10-CM

## 2019-06-26 NOTE — Telephone Encounter (Signed)
Called and LVM with patient to schedule a TOC appt with provider.

## 2019-06-26 NOTE — Telephone Encounter (Signed)
Call to set up app with provider

## 2019-07-03 NOTE — Telephone Encounter (Unsigned)
Copied from CRM [id number]. Topic: Quick Communication - See Telephone Encounter >> Jun 29, 2019  5:03 PM Aretta Nip wrote: CRM for notification. See Telephone encounter for: 06/29/19. Pls fu with pt re a TOC appt from [facility], Not listed as PCP, last visit was at [place]. Needing to confirm N pt status or TOC after hrs FU pt at [phone]

## 2019-09-15 ENCOUNTER — Encounter: Payer: Self-pay | Admitting: Certified Nurse Midwife

## 2019-10-16 ENCOUNTER — Telehealth: Payer: Self-pay | Admitting: Physician Assistant

## 2019-10-16 DIAGNOSIS — I10 Essential (primary) hypertension: Secondary | ICD-10-CM

## 2019-10-16 MED ORDER — METOPROLOL SUCCINATE ER 25 MG PO TB24: 25.0000 mg | ORAL_TABLET | Freq: Every day | ORAL | 0 refills | Status: DC

## 2019-10-16 NOTE — Telephone Encounter (Signed)
MEDICATION: metoprolol 25 MG  PHARMACY: [facility] [address]  Comments: TOC appt on 11/03/2019  **Let patient know to contact pharmacy at the end of the day to make sure medication is ready. **  ** Please notify patient to allow 48-72 hours to process**  **Encourage patient to contact the pharmacy for refills or they can request refills through [facility]**

## 2019-10-16 NOTE — Telephone Encounter (Signed)
Metoprolol sent to patient's pharmacy 

## 2019-11-03 ENCOUNTER — Ambulatory Visit (INDEPENDENT_AMBULATORY_CARE_PROVIDER_SITE_OTHER): Payer: 59 | Admitting: Physician Assistant

## 2019-11-03 ENCOUNTER — Encounter: Payer: Self-pay | Admitting: Physician Assistant

## 2019-11-03 ENCOUNTER — Other Ambulatory Visit: Payer: Self-pay

## 2019-11-03 DIAGNOSIS — I10 Essential (primary) hypertension: Secondary | ICD-10-CM

## 2019-11-03 DIAGNOSIS — G43109 Migraine with aura, not intractable, without status migrainosus: Secondary | ICD-10-CM

## 2019-11-03 MED ORDER — PHENTERMINE HCL 37.5 MG PO TABS: 18.7500 mg | ORAL_TABLET | Freq: Every day | ORAL | 0 refills | Status: DC

## 2019-11-03 MED ORDER — METOPROLOL SUCCINATE ER 25 MG PO TB24: 25.0000 mg | ORAL_TABLET | Freq: Every day | ORAL | 3 refills | Status: DC

## 2019-11-03 MED ORDER — TROKENDI XR 50 MG PO CP24: 1.0000 | ORAL_CAPSULE | Freq: Every day | ORAL | 2 refills | Status: DC

## 2019-11-03 NOTE — Progress Notes (Signed)
Brandi Navarro is a 34 y.o. female is here to transfer care.  I acted as a Education administrator for [employer], PA-C [facility], [place]  History of Present Illness:   Chief Complaint  Patient presents with  . Transitions Of Care    HPI   Migraines Was on topiramate 50 mg daily and tolerated well but unfortunately ran out of this in January. She is having migraines about 2 times a month. Denies use of abortive medication now or in the past.  Obesity She was on qsymia at some point with prior PCP and she would like to re-trial it. Felt like it was helpful for her.  HTN Currently taking Toprol 25 mg. At home blood pressure readings are: well controlled per patient. Patient denies chest pain, SOB, blurred vision, dizziness, unusual headaches, lower leg swelling. Patient is compliant with medication. Denies excessive caffeine intake, stimulant usage, excessive alcohol intake, or increase in salt consumption.  BP Readings from Last 3 Encounters:  11/03/19 126/80  06/05/19 136/90  01/11/19 120/70      Health Maintenance Due  Topic Date Due  . COVID-19 Vaccine (1) Never done    Past Medical History:  Diagnosis Date  . Asthma    well controlled; never on scheduled inhalers  . HSV2   . Hypertension   . Migraines with aura      Social History   Socioeconomic History  . Marital status: Single    Spouse name: Not on file  . Number of children: Not on file  . Years of education: Not on file  . Highest education level: Not on file  Occupational History    Employer: [employer]  Tobacco Use  . Smoking status: Former Smoker    Types: E-cigarettes  . Smokeless tobacco: Never Used  Substance and Sexual Activity  . Alcohol use: Yes    Alcohol/week: 2.0 - 3.0 standard drinks    Types: 2 - 3 Standard drinks or equivalent per week  . Drug use: Yes    Types: Marijuana    Comment: daily  . Sexual activity: Yes    Partners: Male    Birth control/protection: Condom   Comment: condoms occ  Other Topics Concern  . Not on file  Social History Narrative   [employer] -- almost 5 years   No kids   Single   Social Determinants of Health   Financial Resource Strain:   . Difficulty of Paying Living Expenses:   Food Insecurity:   . Worried About Charity fundraiser in the Last Year:   . Arboriculturist in the Last Year:   Transportation Needs:   . Film/video editor (Medical):   Marland Kitchen Lack of Transportation (Non-Medical):   Physical Activity:   . Days of Exercise per Week:   . Minutes of Exercise per Session:   Stress:   . Feeling of Stress :   Social Connections:   . Frequency of Communication with Friends and Family:   . Frequency of Social Gatherings with Friends and Family:   . Attends Religious Services:   . Active Member of Clubs or Organizations:   . Attends Archivist Meetings:   Marland Kitchen Marital Status:   Intimate Partner Violence:   . Fear of Current or Ex-Partner:   . Emotionally Abused:   Marland Kitchen Physically Abused:   . Sexually Abused:     Past Surgical History:  Procedure Laterality Date  . TONSILLECTOMY      Family  History  Problem Relation Age of Onset  . Cancer Other   . Diabetes Maternal Grandfather   . Hypertension Maternal Grandfather   . Anemia Mother   . Anemia Sister   . Anemia Maternal Grandmother     PMHx, SurgHx, SocialHx, FamHx, Medications, and Allergies were reviewed in the Visit Navigator and updated as appropriate.   Patient Active Problem List   Diagnosis Date Noted  . Recurrent tonsillitis 09/12/2018  . Morbid obesity (HCC) 03/22/2018  . 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 Migraine with aura and without status migrainosus, not intractable 12/19/2017  . PMDD (premenstrual dysphoric disorder) 12/19/2017  . Seasonal allergic rhinitis due to pollen 12/19/2017  . Essential hypertension 12/19/2017  . Marijuana user 12/19/2017  . Pure hypercholesterolemia 12/19/2017     Social History   Tobacco Use  . Smoking status: Former Smoker    Types: E-cigarettes  . Smokeless tobacco: Never Used  Substance Use Topics  . Alcohol use: Yes    Alcohol/week: 2.0 - 3.0 standard drinks    Types: 2 - 3 Standard drinks or equivalent per week  . Drug use: Yes    Types: Marijuana    Comment: daily    Current Medications and Allergies:    Current Outpatient Medications:  .  citalopram (CELEXA) 20 MG tablet, TAKE 1 TABLET BY MOUTH ONCE A DAY,   avoid Ibuprofen with this medication, may cause GI bleeding, Disp: 90 tablet, Rfl: 3 .  IBUPROFEN PO, Take by mouth., Disp: , Rfl:  .  loratadine (CLARITIN) 10 MG tablet, Take 10 mg by mouth daily., Disp: , Rfl:  .  metoprolol succinate (TOPROL-XL) 25 MG 24 hr tablet, Take 1 tablet (25 mg total) by mouth daily., Disp: 90 tablet, Rfl: 3 .  Multiple Vitamins-Minerals (MULTIVITAMIN PO), Take by mouth daily., Disp: , Rfl:  .  valACYclovir (VALTREX) 500 MG tablet, One daily for suppression, increase to two daily at outbreak for 3 days, Disp: 90 tablet, Rfl: 4 .  phentermine (ADIPEX-P) 37.5 MG tablet, Take 0.5 tablets (18.75 mg total) by mouth daily before breakfast., Disp: 15 tablet, Rfl: 0 .  Topiramate ER (TROKENDI XR) 50 MG CP24, Take 1 capsule by mouth daily., Disp: 90 capsule, Rfl: 2  No Known Allergies  Review of Systems   ROS  Negative unless otherwise specified per HPI.  Vitals:   Vitals:   11/03/19 1435  BP: 126/80  Pulse: 75  Temp: 97.9 F (36.6 C)  TempSrc: Temporal  SpO2: 96%  Weight: 272 lb 3.2 oz (123.5 kg)  Height: 5' 4.25" (1.632 m)     Body mass index is 46.36 kg/m.   Physical Exam:    Physical Exam Vitals and nursing note reviewed.  Constitutional:      General: She is not in acute distress.    Appearance: She is well-developed. She is not ill-appearing or toxic-appearing.  Cardiovascular:     Rate and Rhythm: Normal rate and regular rhythm.     Pulses: Normal pulses.     Heart  sounds: Normal heart sounds, S1 normal and S2 normal.     Comments: No LE edema Pulmonary:     Effort: Pulmonary effort is normal.     Breath sounds: Normal breath sounds.  Skin:    General: Skin is warm and dry.  Neurological:     Mental Status: She is alert.     GCS: GCS eye subscore is 4. GCS verbal subscore is 5. GCS motor subscore is  6.  Psychiatric:        Speech: Speech normal.        Behavior: Behavior normal. Behavior is cooperative.      Assessment and Plan:    Gennesis was seen today for transitions of care.  Diagnoses and all orders for this visit:  Morbid obesity (HCC)  Essential hypertension Stable with toprol-xl 25 mg. Continue as prescribed. Follow-up in 6 months, sooner if concerns. Orders: -     metoprolol succinate (TOPROL-XL) 25 MG 24 hr tablet; Take 1 tablet (25 mg total) by mouth daily.  Migraine with aura and without status migrainosus, not intractable Re-start trokendi 50 mg daily. Follow-up in 1 month, sooner if concerns. Orders: -     Topiramate ER (TROKENDI XR) 50 MG CP24; Take 1 capsule by mouth daily.  Obesity Will add 1/2 tab phentermine daily, as she did well in the past with qsymia. Follow-up in 1 month. Reviewed side effects and risks of this medication and patient verbalized understanding and accepts risks to medication. Discussed that if she develops chest pain, palpitations, increased bp, to stop medication immediately.  Other orders -     phentermine (ADIPEX-P) 37.5 MG tablet; Take 0.5 tablets (18.75 mg total) by mouth daily before breakfast.    . Reviewed expectations re: course of current medical issues. . Discussed self-management of symptoms. . Outlined signs and symptoms indicating need for more acute intervention. . Patient verbalized understanding and all questions were answered. . See orders for this visit as documented in the electronic medical record. . Patient received an After Visit Summary.  Jarold Motto,  PA-C [place], [place] 11/03/2019  Follow-up: No follow-ups on file.

## 2019-11-03 NOTE — Patient Instructions (Addendum)
It was great to see you!  Continue metoprolol -- refills sent.  Restart trokendi 50 mg daily.  Start 1/2 tablet phentermine daily.  Let's follow-up in 1 month, sooner if you have concerns -- may be virtual or in office.  Take care,  Jarold Motto PA-C

## 2019-12-05 ENCOUNTER — Other Ambulatory Visit: Payer: Self-pay

## 2019-12-05 ENCOUNTER — Encounter: Payer: Self-pay | Admitting: Physician Assistant

## 2019-12-05 ENCOUNTER — Ambulatory Visit: Payer: 59 | Admitting: Physician Assistant

## 2019-12-05 VITALS — BP 122/82 | HR 80 | Temp 98.5°F | Ht 64.25 in | Wt 267.4 lb

## 2019-12-05 DIAGNOSIS — I10 Essential (primary) hypertension: Secondary | ICD-10-CM | POA: Diagnosis not present

## 2019-12-05 MED ORDER — PHENTERMINE HCL 37.5 MG PO TABS: 37.5000 mg | ORAL_TABLET | Freq: Every day | ORAL | 2 refills | Status: DC

## 2019-12-05 NOTE — Patient Instructions (Signed)
It was great to see you!  Let's trial increase of phentermine to full tablet daily. If eye twitching worsens, let me know.  Follow-up in 3 months -- sooner if concerns.   Take care,  Jarold Motto PA-C

## 2019-12-05 NOTE — Progress Notes (Signed)
Brandi Navarro is a 34 y.o. female here for a follow up of a pre-existing problem.  SCRIBE STATEMENT  History of Present Illness:   Chief Complaint  Patient presents with  . Migraine  . Hypertension  . Medication Problem    Rt eye twitch; Noticed 2 weeks ago.    HPI  HTN Currently taking toprol xl 25 mg. At home blood pressure readings are: not checked. Patient denies chest pain, SOB, blurred vision, dizziness, unusual headaches, lower leg swelling. Patient is compliant with medication. Denies excessive caffeine intake, stimulant usage, excessive alcohol intake, or increase in salt consumption.  BP Readings from Last 3 Encounters:  12/05/19 122/82  11/03/19 126/80  06/05/19 136/90    Obesity Doing well with the 1/2 tab of phentermine daily. She has lost 5 lb in 1 mo. Tolerating without issue. She is taking trokendi 50 mg daily and tolerating well, but does have a slight twitch to her R eye. She states that she thinks that she experienced this in the past when she was on this medication. She states that her symptoms are not so severe that she feels like she needs to stop the medication. Denies: unusual HA, changes to vision.  Wt Readings from Last 4 Encounters:  12/05/19 267 lb 6.4 oz (121.3 kg)  11/03/19 272 lb 3.2 oz (123.5 kg)  06/05/19 258 lb (117 kg)  01/11/19 255 lb (115.7 kg)       Past Medical History:  Diagnosis Date  . Asthma    well controlled; never on scheduled inhalers  . HSV2   . Hypertension   . Migraines with aura      Social History   Tobacco Use  . Smoking status: Former Smoker    Types: E-cigarettes  . Smokeless tobacco: Never Used  Substance Use Topics  . Alcohol use: Yes    Alcohol/week: 2.0 - 3.0 standard drinks    Types: 2 - 3 Standard drinks or equivalent per week  . Drug use: Yes    Types: Marijuana    Comment: daily    Past Surgical History:  Procedure Laterality Date  . TONSILLECTOMY      Family History  Problem Relation  Age of Onset  . Cancer Other   . Diabetes Maternal Grandfather   . Hypertension Maternal Grandfather   . Anemia Mother   . Anemia Sister   . Anemia Maternal Grandmother     No Known Allergies  Current Medications:   Current Outpatient Medications:  .  citalopram (CELEXA) 20 MG tablet, TAKE 1 TABLET BY MOUTH ONCE A DAY,   avoid Ibuprofen with this medication, may cause GI bleeding, Disp: 90 tablet, Rfl: 3 .  IBUPROFEN PO, Take by mouth., Disp: , Rfl:  .  loratadine (CLARITIN) 10 MG tablet, Take 10 mg by mouth daily., Disp: , Rfl:  .  metoprolol succinate (TOPROL-XL) 25 MG 24 hr tablet, Take 1 tablet (25 mg total) by mouth daily., Disp: 90 tablet, Rfl: 3 .  Multiple Vitamins-Minerals (MULTIVITAMIN PO), Take by mouth daily., Disp: , Rfl:  .  Topiramate ER (TROKENDI XR) 50 MG CP24, Take 1 capsule by mouth daily., Disp: 90 capsule, Rfl: 2 .  valACYclovir (VALTREX) 500 MG tablet, One daily for suppression, increase to two daily at outbreak for 3 days, Disp: 90 tablet, Rfl: 4 .  phentermine (ADIPEX-P) 37.5 MG tablet, Take 1 tablet (37.5 mg total) by mouth daily before breakfast., Disp: 30 tablet, Rfl: 2   Review of Systems:  ROS  Negative unless otherwise specified per HPI.  Vitals:   Vitals:   12/05/19 1429  BP: 122/82  Pulse: 80  Temp: 98.5 F (36.9 C)  TempSrc: Temporal  SpO2: 98%  Weight: 267 lb 6.4 oz (121.3 kg)  Height: 5' 4.25" (1.632 m)     Body mass index is 45.54 kg/m.  Physical Exam:   Physical Exam Vitals and nursing note reviewed.  Constitutional:      General: She is not in acute distress.    Appearance: She is well-developed. She is not ill-appearing or toxic-appearing.  Cardiovascular:     Rate and Rhythm: Normal rate and regular rhythm.     Pulses: Normal pulses.     Heart sounds: Normal heart sounds, S1 normal and S2 normal.     Comments: No LE edema Pulmonary:     Effort: Pulmonary effort is normal.     Breath sounds: Normal breath sounds.   Skin:    General: Skin is warm and dry.  Neurological:     Mental Status: She is alert.     GCS: GCS eye subscore is 4. GCS verbal subscore is 5. GCS motor subscore is 6.  Psychiatric:        Speech: Speech normal.        Behavior: Behavior normal. Behavior is cooperative.       Assessment and Plan:   Brandi Navarro was seen today for migraine, hypertension and medication problem.  Diagnoses and all orders for this visit:  Essential hypertension Normotensive. Continue low dose metoprolol and follow-up in 3 months, sooner if concerns.  Morbid obesity ([place]) Doing well with phentermine and trokendi. She would like to continue trokendi at current dosage despite eye twitch side effects. Will increase phentermine to full tablet daily. Encouraged patient to discontinue medication or decrease if she develops any chest pain, SOB, palpitations or other concerns. Patient is aware of risks of medication and would like to proceed with taking medication.  Other orders -     phentermine (ADIPEX-P) 37.5 MG tablet; Take 1 tablet (37.5 mg total) by mouth daily before breakfast.  . Reviewed expectations re: course of current medical issues. . Discussed self-management of symptoms. . Outlined signs and symptoms indicating need for more acute intervention. . Patient verbalized understanding and all questions were answered. . See orders for this visit as documented in the electronic medical record. . Patient received an After-Visit Summary.  Brandi Coke, PA-C

## 2020-01-05 ENCOUNTER — Telehealth: Payer: Self-pay

## 2020-01-05 NOTE — Telephone Encounter (Signed)
Spoke with pt. Pt needs reschedule of AEX due to [name] on 01/23/20. Pt scheduled with Dr Oscar La for AEX on 02/14/20 at 430 pm. Pt agreeable and verbalized understanding of date and time of appt.   Routing to Dr Oscar La for review.  Encounter closed.

## 2020-01-05 NOTE — Telephone Encounter (Signed)
Was asked to move patients AEX from date of (01/23/20) due to Ultrasounds that day. Offered patient next available on (03/07/20). Patient does not want to wait that long. Need triage to assist.

## 2020-01-16 ENCOUNTER — Ambulatory Visit: Payer: 59 | Admitting: Certified Nurse Midwife

## 2020-01-22 ENCOUNTER — Other Ambulatory Visit: Payer: Self-pay | Admitting: *Deleted

## 2020-01-22 DIAGNOSIS — Z8619 Personal history of other infectious and parasitic diseases: Secondary | ICD-10-CM

## 2020-01-22 DIAGNOSIS — F4323 Adjustment disorder with mixed anxiety and depressed mood: Secondary | ICD-10-CM

## 2020-01-22 NOTE — Telephone Encounter (Signed)
Medication refill request: Celexa Last AEX:  01-11-2019 DL  Next AEX: [phone]  Last MMG (if hormonal medication request): n/a Refill authorized: Today, please advise.   Medication refill request: Valtrex Refill authorized: Today, please advise.   Medications pended for 30 day supply. Please refill if appropriate.

## 2020-01-23 ENCOUNTER — Other Ambulatory Visit: Payer: Self-pay

## 2020-01-23 ENCOUNTER — Ambulatory Visit: Payer: 59 | Admitting: Obstetrics and Gynecology

## 2020-01-23 MED ORDER — CITALOPRAM HYDROBROMIDE 20 MG PO TABS: ORAL_TABLET | ORAL | 0 refills | Status: DC

## 2020-01-23 MED ORDER — VALACYCLOVIR HCL 500 MG PO TABS: ORAL_TABLET | ORAL | 0 refills | Status: DC

## 2020-01-23 NOTE — Telephone Encounter (Signed)
Script already in que - error   [employer], CMA

## 2020-02-12 NOTE — Progress Notes (Signed)
34 y.o. G0P0000 Single White or Caucasian Not Hispanic or Latino female here for annual exam.   H/O HTN and migraines with aura.  She had a kyleena IUD inserted on 06/30/18, had it removed the next day secondary to cramping.   She got covid at the end of last year, her cycle came 2 weeks early and was bad. Since January her cycles have ranged from 2.5-6 weeks, only one cycle was closer than 3 weeks. Typically cycles are 7 days. She can saturate a super tampon in 2-3 hours. Cramps are terrible. In the past 3 months her cycles cycles have been lasting up to 2 weeks with one week of spotting then 7 days of a period.  Sexually active, new partner in the last couple of months, using condoms.   H/O HSV, on suppression.  She is working on weight loss, on phentamine.   Period Duration (Days): 7-14 Period Pattern: (!) Irregular Menstrual Flow: Heavy Menstrual Control: Tampon Menstrual Control Change Freq (Hours): changes tampon every "couple hours" Dysmenorrhea: (!) Severe Dysmenorrhea Symptoms: Cramping  Patient's last menstrual period was 01/31/2020.          Sexually active: Yes.    The current method of family planning is condoms every time.    Exercising: Yes.    cardio Smoker:  Former smoker   Health Maintenance: Pap:7/15/20WNL HPV Neg,   12/10/16 WNL  History of abnormal Pap:  no MMG: 11-28-13 rt breast u/s neg  BMD:   Never  Colonoscopy: never  TDaP:  12/10/16 Gardasil: complete    reports that she has quit smoking. Her smoking use included e-cigarettes. She has never used smokeless tobacco. She reports current alcohol use of about 2.0 - 3.0 standard drinks of alcohol per week. She reports current drug use. Drug: Marijuana. Drinks ~5-7 drinks a week.  She works as a Social worker at [employer].   Past Medical History:  Diagnosis Date  . Asthma    well controlled; never on scheduled inhalers  . HSV2   . Hypertension   . Migraines with aura     Past Surgical History:   Procedure Laterality Date  . TONSILLECTOMY      Current Outpatient Medications  Medication Sig Dispense Refill  . citalopram (CELEXA) 20 MG tablet TAKE 1 TABLET BY MOUTH ONCE A DAY,   avoid Ibuprofen with this medication, may cause GI bleeding 30 tablet 0  . IBUPROFEN PO Take by mouth.    . loratadine (CLARITIN) 10 MG tablet Take 10 mg by mouth daily.    . metoprolol succinate (TOPROL-XL) 25 MG 24 hr tablet Take 1 tablet (25 mg total) by mouth daily. 90 tablet 3  . Multiple Vitamins-Minerals (MULTIVITAMIN PO) Take by mouth daily.    . Topiramate ER (TROKENDI XR) 50 MG CP24 Take 1 capsule by mouth daily. 90 capsule 2  . valACYclovir (VALTREX) 500 MG tablet One daily for suppression, increase to two daily at outbreak for 3 days 30 tablet 0  . phentermine (ADIPEX-P) 37.5 MG tablet Take 1 tablet (37.5 mg total) by mouth daily before breakfast. 30 tablet 2   No current facility-administered medications for this visit.    Family History  Problem Relation Age of Onset  . Cancer Other   . Diabetes Maternal Grandfather   . Hypertension Maternal Grandfather   . Anemia Mother   . Anemia Sister   . Anemia Maternal Grandmother     Review of Systems  Genitourinary: Positive for menstrual problem.  Irregular heavy periods  All other systems reviewed and are negative.   Exam:   BP 136/88 (BP Location: Right Arm, Patient Position: Sitting, Cuff Size: Large)   Pulse 90   Resp 14   Ht 5\' 4"  (1.626 m)   Wt 253 lb (114.8 kg)   LMP 01/31/2020   BMI 43.43 kg/m   Weight change: @WEIGHTCHANGE @ Height:   Height: 5\' 4"  (162.6 cm)  Ht Readings from Last 3 Encounters:  02/14/20 5\' 4"  (1.626 m)  12/05/19 5' 4.25" (1.632 m)  11/03/19 5' 4.25" (1.632 m)    General appearance: alert, cooperative and appears stated age Head: Normocephalic, without obvious abnormality, atraumatic Neck: no adenopathy, supple, symmetrical, trachea midline and thyroid normal to inspection and palpation Lungs:  clear to auscultation bilaterally Cardiovascular: regular rate and rhythm Breasts: normal appearance, no masses or tenderness Abdomen: soft, non-tender; non distended,  no masses,  no organomegaly Extremities: extremities normal, atraumatic, no cyanosis or edema Skin: Skin color, texture, turgor normal. No rashes or lesions Lymph nodes: Cervical, supraclavicular, and axillary nodes normal. No abnormal inguinal nodes palpated Neurologic: Grossly normal   Pelvic: External genitalia:  no lesions              Urethra:  normal appearing urethra with no masses, tenderness or lesions              Bartholins and Skenes: normal                 Vagina: normal appearing vagina with normal color and discharge, no lesions              Cervix: no lesions               Bimanual Exam:  Uterus:  no masses or tenderness              Adnexa: no mass, fullness, tenderness               Rectovaginal: Confirms               Anus:  normal sphincter tone, no lesions  chaperoned for the exam.  A:  Well Woman with normal exam  BMI 43  Irregular menses, spotting in between  H/O HTN  H/O migraine with aura  P:   Pap with reflex hpv  Screening labs, HgbA1C, TSH, Prolactin  STD testing  Will schedule ultrasound  Discussed breast self exam  Discussed calcium and vit D intake  Consider micronor (can't take OCP's, didn't tolerate the kyleena) or  Cyclic provera. She is concerned about worsening acne.  Refilled celexa and valtrex

## 2020-02-14 ENCOUNTER — Other Ambulatory Visit (HOSPITAL_COMMUNITY)
Admission: RE | Admit: 2020-02-14 | Discharge: 2020-02-14 | Disposition: A | Discharge status: Home / Self Care | Payer: 59 | Source: Ambulatory Visit | Attending: Obstetrics and Gynecology | Admitting: Obstetrics and Gynecology

## 2020-02-14 ENCOUNTER — Ambulatory Visit: Payer: 59 | Admitting: Obstetrics and Gynecology

## 2020-02-14 ENCOUNTER — Other Ambulatory Visit: Payer: Self-pay

## 2020-02-14 ENCOUNTER — Encounter: Payer: Self-pay | Admitting: Obstetrics and Gynecology

## 2020-02-14 VITALS — BP 136/88 | HR 90 | Resp 14 | Ht 64.0 in | Wt 253.0 lb

## 2020-02-14 DIAGNOSIS — Z8679 Personal history of other diseases of the circulatory system: Secondary | ICD-10-CM

## 2020-02-14 DIAGNOSIS — N923 Ovulation bleeding: Secondary | ICD-10-CM

## 2020-02-14 DIAGNOSIS — Z113 Encounter for screening for infections with a predominantly sexual mode of transmission: Secondary | ICD-10-CM

## 2020-02-14 DIAGNOSIS — N926 Irregular menstruation, unspecified: Secondary | ICD-10-CM

## 2020-02-14 DIAGNOSIS — Z124 Encounter for screening for malignant neoplasm of cervix: Secondary | ICD-10-CM | POA: Diagnosis present

## 2020-02-14 DIAGNOSIS — G43109 Migraine with aura, not intractable, without status migrainosus: Secondary | ICD-10-CM

## 2020-02-14 DIAGNOSIS — F4323 Adjustment disorder with mixed anxiety and depressed mood: Secondary | ICD-10-CM

## 2020-02-14 DIAGNOSIS — Z01419 Encounter for gynecological examination (general) (routine) without abnormal findings: Secondary | ICD-10-CM | POA: Diagnosis not present

## 2020-02-14 DIAGNOSIS — Z0000 Encounter for general adult medical examination without abnormal findings: Secondary | ICD-10-CM

## 2020-02-14 DIAGNOSIS — Z6841 Body Mass Index (BMI) 40.0 and over, adult: Secondary | ICD-10-CM

## 2020-02-14 DIAGNOSIS — Z8619 Personal history of other infectious and parasitic diseases: Secondary | ICD-10-CM

## 2020-02-14 MED ORDER — CITALOPRAM HYDROBROMIDE 20 MG PO TABS: ORAL_TABLET | ORAL | 4 refills | Status: DC

## 2020-02-14 MED ORDER — VALACYCLOVIR HCL 500 MG PO TABS: ORAL_TABLET | ORAL | 4 refills | Status: DC

## 2020-02-14 NOTE — Patient Instructions (Signed)
EXERCISE AND DIET:  We recommended that you start or continue a regular exercise program for good health. Regular exercise means any activity that makes your heart beat faster and makes you sweat.  We recommend exercising at least 30 minutes per day at least 3 days a week, preferably 4 or 5.  We also recommend a diet low in fat and sugar.  Inactivity, poor dietary choices and obesity can cause diabetes, heart attack, stroke, and kidney damage, among others.    ALCOHOL AND SMOKING:  Women should limit their alcohol intake to no more than 7 drinks/beers/glasses of wine (combined, not each!) per week. Moderation of alcohol intake to this level decreases your risk of breast cancer and liver damage. And of course, no recreational drugs are part of a healthy lifestyle.  And absolutely no smoking or even second hand smoke. Most people know smoking can cause heart and lung diseases, but did you know it also contributes to weakening of your bones? Aging of your skin?  Yellowing of your teeth and nails?  CALCIUM AND VITAMIN D:  Adequate intake of calcium and Vitamin D are recommended.  The recommendations for exact amounts of these supplements seem to change often, but generally speaking 1,000 mg of calcium (between diet and supplement) and 800 units of Vitamin D per day seems prudent. Certain women may benefit from higher intake of Vitamin D.  If you are among these women, your doctor will have told you during your visit.    PAP SMEARS:  Pap smears, to check for cervical cancer or precancers,  have traditionally been done yearly, although recent scientific advances have shown that most women can have pap smears less often.  However, every woman still should have a physical exam from her gynecologist every year. It will include a breast check, inspection of the vulva and vagina to check for abnormal growths or skin changes, a visual exam of the cervix, and then an exam to evaluate the size and shape of the uterus and  ovaries.  And after 32 years of age, a rectal exam is indicated to check for rectal cancers. We will also provide age appropriate advice regarding health maintenance, like when you should have certain vaccines, screening for sexually transmitted diseases, bone density testing, colonoscopy, mammograms, etc.   MAMMOGRAMS:  All women over 40 years old should have a yearly mammogram. Many facilities now offer a "3D" mammogram, which may cost around $50 extra out of pocket. If possible,  we recommend you accept the option to have the 3D mammogram performed.  It both reduces the number of women who will be called back for extra views which then turn out to be normal, and it is better than the routine mammogram at detecting truly abnormal areas.    COLON CANCER SCREENING: Now recommend starting at age 45. At this time colonoscopy is not covered for routine screening until 50. There are take home tests that can be done between 45-49.   COLONOSCOPY:  Colonoscopy to screen for colon cancer is recommended for all women at age 50.  We know, you hate the idea of the prep.  We agree, BUT, having colon cancer and not knowing it is worse!!  Colon cancer so often starts as a polyp that can be seen and removed at colonscopy, which can quite literally save your life!  And if your first colonoscopy is normal and you have no family history of colon cancer, most women don't have to have it again for   10 years.  Once every ten years, you can do something that may end up saving your life, right?  We will be happy to help you get it scheduled when you are ready.  Be sure to check your insurance coverage so you understand how much it will cost.  It may be covered as a preventative service at no cost, but you should check your particular policy.      Breast Self-Awareness Breast self-awareness means being familiar with how your breasts look and feel. It involves checking your breasts regularly and reporting any changes to your  health care provider. Practicing breast self-awareness is important. A change in your breasts can be a sign of a serious medical problem. Being familiar with how your breasts look and feel allows you to find any problems early, when treatment is more likely to be successful. All women should practice breast self-awareness, including women who have had breast implants. How to do a breast self-exam One way to learn what is normal for your breasts and whether your breasts are changing is to do a breast self-exam. To do a breast self-exam: Look for Changes  1. Remove all the clothing above your waist. 2. Stand in front of a mirror in a room with good lighting. 3. Put your hands on your hips. 4. Push your hands firmly downward. 5. Compare your breasts in the mirror. Look for differences between them (asymmetry), such as: ? Differences in shape. ? Differences in size. ? Puckers, dips, and bumps in one breast and not the other. 6. Look at each breast for changes in your skin, such as: ? Redness. ? Scaly areas. 7. Look for changes in your nipples, such as: ? Discharge. ? Bleeding. ? Dimpling. ? Redness. ? A change in position. Feel for Changes Carefully feel your breasts for lumps and changes. It is best to do this while lying on your back on the floor and again while sitting or standing in the shower or tub with soapy water on your skin. Feel each breast in the following way:  Place the arm on the side of the breast you are examining above your head.  Feel your breast with the other hand.  Start in the nipple area and make  inch (2 cm) overlapping circles to feel your breast. Use the pads of your three middle fingers to do this. Apply light pressure, then medium pressure, then firm pressure. The light pressure will allow you to feel the tissue closest to the skin. The medium pressure will allow you to feel the tissue that is a little deeper. The firm pressure will allow you to feel the tissue  close to the ribs.  Continue the overlapping circles, moving downward over the breast until you feel your ribs below your breast.  Move one finger-width toward the center of the body. Continue to use the  inch (2 cm) overlapping circles to feel your breast as you move slowly up toward your collarbone.  Continue the up and down exam using all three pressures until you reach your armpit.  Write Down What You Find  Write down what is normal for each breast and any changes that you find. Keep a written record with breast changes or normal findings for each breast. By writing this information down, you do not need to depend only on memory for size, tenderness, or location. Write down where you are in your menstrual cycle, if you are still menstruating. If you are having trouble noticing differences   in your breasts, do not get discouraged. With time you will become more familiar with the variations in your breasts and more comfortable with the exam. How often should I examine my breasts? Examine your breasts every month. If you are breastfeeding, the best time to examine your breasts is after a feeding or after using a breast pump. If you menstruate, the best time to examine your breasts is 5-7 days after your period is over. During your period, your breasts are lumpier, and it may be more difficult to notice changes. When should I see my health care provider? See your health care provider if you notice:  A change in shape or size of your breasts or nipples.  A change in the skin of your breast or nipples, such as a reddened or scaly area.  Unusual discharge from your nipples.  A lump or thick area that was not there before.  Pain in your breasts.  Anything that concerns you.  

## 2020-02-15 ENCOUNTER — Telehealth: Payer: Self-pay | Admitting: Obstetrics and Gynecology

## 2020-02-15 LAB — COMPREHENSIVE METABOLIC PANEL
ALT: 49 IU/L — ABNORMAL HIGH (ref 0–32)
AST: 34 IU/L (ref 0–40)
Albumin/Globulin Ratio: 1.8 (ref 1.2–2.2)
Albumin: 4.6 g/dL (ref 3.8–4.8)
Alkaline Phosphatase: 134 IU/L — ABNORMAL HIGH (ref 48–121)
BUN/Creatinine Ratio: 21 (ref 9–23)
BUN: 16 mg/dL (ref 6–20)
Bilirubin Total: 0.2 mg/dL (ref 0.0–1.2)
CO2: 22 mmol/L (ref 20–29)
Calcium: 9.8 mg/dL (ref 8.7–10.2)
Chloride: 104 mmol/L (ref 96–106)
Creatinine, Ser: 0.78 mg/dL (ref 0.57–1.00)
GFR calc Af Amer: 116 mL/min/{1.73_m2} (ref >59)
GFR calc non Af Amer: 101 mL/min/{1.73_m2} (ref >59)
Globulin, Total: 2.5 g/dL (ref 1.5–4.5)
Glucose: 100 mg/dL — ABNORMAL HIGH (ref 65–99)
Potassium: 4.2 mmol/L (ref 3.5–5.2)
Sodium: 140 mmol/L (ref 134–144)
Total Protein: 7.1 g/dL (ref 6.0–8.5)

## 2020-02-15 LAB — HEMOGLOBIN A1C
Est. average glucose Bld gHb Est-mCnc: 105 mg/dL
Hgb A1c MFr Bld: 5.3 % (ref 4.8–5.6)

## 2020-02-15 LAB — CBC
Hematocrit: 40.7 % (ref 34.0–46.6)
Hemoglobin: 14.1 g/dL (ref 11.1–15.9)
MCH: 32.4 pg (ref 26.6–33.0)
MCHC: 34.6 g/dL (ref 31.5–35.7)
MCV: 94 fL (ref 79–97)
Platelets: 355 10*3/uL (ref 150–450)
RBC: 4.35 x10E6/uL (ref 3.77–5.28)
RDW: 13.1 % (ref 11.7–15.4)
WBC: 9.5 10*3/uL (ref 3.4–10.8)

## 2020-02-15 LAB — TSH: TSH: 4.1 u[IU]/mL (ref 0.450–4.500)

## 2020-02-15 LAB — LIPID PANEL
Chol/HDL Ratio: 5.5 ratio — ABNORMAL HIGH (ref 0.0–4.4)
Cholesterol, Total: 246 mg/dL — ABNORMAL HIGH (ref 100–199)
HDL: 45 mg/dL (ref >39)
LDL Chol Calc (NIH): 162 mg/dL — ABNORMAL HIGH (ref 0–99)
Triglycerides: 209 mg/dL — ABNORMAL HIGH (ref 0–149)
VLDL Cholesterol Cal: 39 mg/dL (ref 5–40)

## 2020-02-15 LAB — PROLACTIN: Prolactin: 21.2 ng/mL (ref 4.8–23.3)

## 2020-02-15 LAB — RPR: RPR Ser Ql: NONREACTIVE

## 2020-02-15 LAB — HIV ANTIBODY (ROUTINE TESTING W REFLEX): HIV Screen 4th Generation wRfx: NONREACTIVE

## 2020-02-15 NOTE — Telephone Encounter (Signed)
Spoke with patient regarding benefits for recommended ultrasound. Patient is aware that ultrasound is transvaginal. Patient acknowledges understanding of information presented. Patient is aware of cancellation policy. Patient scheduled appointment for 02/20/2020 at 0130PM with Gertie Exon, MD. Encounter closed.

## 2020-02-16 LAB — CYTOLOGY - PAP: Diagnosis: NEGATIVE

## 2020-02-16 LAB — CHLAMYDIA/GONOCOCCUS/TRICHOMONAS, NAA
Chlamydia by NAA: NEGATIVE
Gonococcus by NAA: NEGATIVE
Trich vag by NAA: NEGATIVE

## 2020-02-20 ENCOUNTER — Other Ambulatory Visit: Payer: Self-pay

## 2020-02-20 ENCOUNTER — Ambulatory Visit (INDEPENDENT_AMBULATORY_CARE_PROVIDER_SITE_OTHER): Payer: 59

## 2020-02-20 ENCOUNTER — Ambulatory Visit: Payer: 59 | Admitting: Obstetrics and Gynecology

## 2020-02-20 ENCOUNTER — Encounter: Payer: Self-pay | Admitting: Obstetrics and Gynecology

## 2020-02-20 VITALS — BP 118/64 | HR 72 | Ht 64.0 in | Wt 253.0 lb

## 2020-02-20 DIAGNOSIS — E282 Polycystic ovarian syndrome: Secondary | ICD-10-CM | POA: Diagnosis not present

## 2020-02-20 DIAGNOSIS — N921 Excessive and frequent menstruation with irregular cycle: Secondary | ICD-10-CM

## 2020-02-20 DIAGNOSIS — Z3009 Encounter for other general counseling and advice on contraception: Secondary | ICD-10-CM | POA: Diagnosis not present

## 2020-02-20 DIAGNOSIS — N923 Ovulation bleeding: Secondary | ICD-10-CM

## 2020-02-20 DIAGNOSIS — N926 Irregular menstruation, unspecified: Secondary | ICD-10-CM | POA: Diagnosis not present

## 2020-02-20 DIAGNOSIS — L700 Acne vulgaris: Secondary | ICD-10-CM

## 2020-02-20 MED ORDER — NORETHINDRONE 0.35 MG PO TABS: 1.0000 | ORAL_TABLET | Freq: Every day | ORAL | 3 refills | Status: DC

## 2020-02-20 NOTE — Progress Notes (Signed)
GYNECOLOGY  VISIT   HPI: 34 y.o.   Single White or Caucasian Not Hispanic or Latino  female   G0P0000 with Patient's last menstrual period was 01/31/2020.   here for  Ultrasound consult for irregular menses. H/O HTN and migraines with aura. H/O acne.  Negative cervical cultures, normal TSH, prolactin and CBC. Normal pap.  Cycles every 2.5-6 weeks x 7-14 days.   GYNECOLOGIC HISTORY: Patient's last menstrual period was 01/31/2020. Contraception:none  Menopausal hormone therapy: none         OB History    Gravida  0   Para  0   Term  0   Preterm  0   AB  0   Living  0     SAB  0   TAB  0   Ectopic  0   Multiple  0   Live Births                 Patient Active Problem List   Diagnosis Date Noted  . Recurrent tonsillitis 09/12/2018  . Morbid obesity (HCC) 03/22/2018  . 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 Migraine with aura and without status migrainosus, not intractable 12/19/2017  . PMDD (premenstrual dysphoric disorder) 12/19/2017  . Seasonal allergic rhinitis due to pollen 12/19/2017  . Essential hypertension 12/19/2017  . Marijuana user 12/19/2017  . Pure hypercholesterolemia 12/19/2017    Past Medical History:  Diagnosis Date  . Asthma    well controlled; never on scheduled inhalers  . HSV2   . Hypertension   . Migraines with aura     Past Surgical History:  Procedure Laterality Date  . TONSILLECTOMY      Current Outpatient Medications  Medication Sig Dispense Refill  . citalopram (CELEXA) 20 MG tablet TAKE 1 TABLET BY MOUTH ONCE A DAY,   avoid Ibuprofen with this medication, may cause GI bleeding 90 tablet 4  . IBUPROFEN PO Take by mouth.    . loratadine (CLARITIN) 10 MG tablet Take 10 mg by mouth daily.    . metoprolol succinate (TOPROL-XL) 25 MG 24 hr tablet Take 1 tablet (25 mg total) by mouth daily. 90 tablet 3  . Multiple Vitamins-Minerals (MULTIVITAMIN PO) Take by mouth daily.    . Topiramate ER (TROKENDI XR) 50 MG  CP24 Take 1 capsule by mouth daily. 90 capsule 2  . valACYclovir (VALTREX) 500 MG tablet One daily for suppression, increase to two daily at outbreak for 3 days 90 tablet 4  . norethindrone (MICRONOR) 0.35 MG tablet Take 1 tablet (0.35 mg total) by mouth daily. 3 tablet 3  . phentermine (ADIPEX-P) 37.5 MG tablet Take 1 tablet (37.5 mg total) by mouth daily before breakfast. 30 tablet 2   No current facility-administered medications for this visit.   Micronor added today  ALLERGIES: Patient has no known allergies.  Family History  Problem Relation Age of Onset  . Cancer Other   . Diabetes Maternal Grandfather   . Hypertension Maternal Grandfather   . Anemia Mother   . Anemia Sister   . Anemia Maternal Grandmother     Social History   Socioeconomic History  . Marital status: Single    Spouse name: Not on file  . Number of children: Not on file  . Years of education: Not on file  . Highest education level: Not on file  Occupational History    Employer: [employer]  Tobacco Use  . Smoking status: Former Smoker    Types: E-cigarettes  .  Smokeless tobacco: Never Used  Vaping Use  . Vaping Use: Never used  Substance and Sexual Activity  . Alcohol use: Yes    Alcohol/week: 2.0 - 3.0 standard drinks    Types: 2 - 3 Standard drinks or equivalent per week  . Drug use: Yes    Types: Marijuana    Comment: daily  . Sexual activity: Yes    Partners: Male    Birth control/protection: Condom    Comment: condoms occ  Other Topics Concern  . Not on file  Social History Narrative   Works at [employer] -- almost 5 years   No kids   Single   Social Determinants of Corporate investment banker Strain:   . Difficulty of Paying Living Expenses: Not on file  Food Insecurity:   . Worried About Programme researcher, broadcasting/film/video in the Last Year: Not on file  . Ran Out of Food in the Last Year: Not on file  Transportation Needs:   . Lack of Transportation (Medical): Not on file  .  Lack of Transportation (Non-Medical): Not on file  Physical Activity:   . Days of Exercise per Week: Not on file  . Minutes of Exercise per Session: Not on file  Stress:   . Feeling of Stress : Not on file  Social Connections:   . Frequency of Communication with Friends and Family: Not on file  . Frequency of Social Gatherings with Friends and Family: Not on file  . Attends Religious Services: Not on file  . Active Member of Clubs or Organizations: Not on file  . Attends Banker Meetings: Not on file  . Marital Status: Not on file  Intimate Partner Violence:   . Fear of Current or Ex-Partner: Not on file  . Emotionally Abused: Not on file  . Physically Abused: Not on file  . Sexually Abused: Not on file    Review of Systems  All other systems reviewed and are negative.   PHYSICAL EXAMINATION:    BP 118/64   Pulse 72   Ht 5\' 4"  (1.626 m)   Wt 253 lb (114.8 kg)   LMP 01/31/2020   SpO2 99%   BMI 43.43 kg/m     General appearance: alert, cooperative and appears stated age Skin: she has some covered up acne on her neck and behind her ear.    Ultrasound images reviewed with the patient.  ASSESSMENT Menometrorrhagia, not candidate for OCP's, didn't tolerate the kyleena Acne, concerned it will worsen on medication.  Discussed options of micronor and cyclic provera for endometrial protection and micronor for contraception Ultrasound normal uterus, ovaries with PCO appearance Picture is c/w PCOS. Discussed PCOS, irregular menses, acne, elevated BMI and polycystic appearing ovaries.  Discussed weight loss which she is working on     04/01/2020 She would like to start micronor, she is aware that it could worsen her acne Names of Dermatologists given, recommended she establish care Discussed the option of retrying a progesterone containing IUD, if chooses to do this would place with ultrasound guidance She is on Topamax, discussed that this can decrease the  effectiveness of micronor and recommended that she continue to use condoms She will call or come in to f/u in 3 months. Call with any concerns  Information on PCOS printed and given to the patient from the Up To Date site.    In addition to reviewing and explaining the ultrasound images, over 25 minutes was spent in discussing management

## 2020-02-20 NOTE — Patient Instructions (Signed)
Polycystic Ovarian Syndrome  Polycystic ovarian syndrome (PCOS) is a common hormonal disorder among women of reproductive age. In most women with PCOS, many small fluid-filled sacs (cysts) grow on the ovaries, and the cysts are not part of a normal menstrual cycle. PCOS can cause problems with your menstrual periods and make it difficult to get pregnant. It can also cause an increased risk of miscarriage with pregnancy. If it is not treated, PCOS can lead to serious health problems, such as diabetes and heart disease. What are the causes? The cause of PCOS is not known, but it may be the result of a combination of certain factors, such as:  Irregular menstrual cycle.  High levels of certain hormones (androgens).  Problems with the hormone that helps to control blood sugar (insulin resistance).  Certain genes. What increases the risk? This condition is more likely to develop in women who have a family history of PCOS. What are the signs or symptoms? Symptoms of PCOS may include:  Multiple ovarian cysts.  Infrequent periods or no periods.  Periods that are too frequent or too heavy.  Unpredictable periods.  Inability to get pregnant (infertility) because of not ovulating.  Increased growth of hair on the face, chest, stomach, back, thumbs, thighs, or toes.  Acne or oily skin. Acne may develop during adulthood, and it may not respond to treatment.  Pelvic pain.  Weight gain or obesity.  Patches of thickened and dark brown or black skin on the neck, arms, breasts, or thighs (acanthosis nigricans).  Excess hair growth on the face, chest, abdomen, or upper thighs (hirsutism). How is this diagnosed? This condition is diagnosed based on:  Your medical history.  A physical exam, including a pelvic exam. Your health care provider may look for areas of increased hair growth on your skin.  Tests, such as: ? Ultrasound. This may be used to examine the ovaries and the lining of the  uterus (endometrium) for cysts. ? Blood tests. These may be used to check levels of sugar (glucose), female hormone (testosterone), and female hormones (estrogen and progesterone) in your blood. How is this treated? There is no cure for PCOS, but treatment can help to manage symptoms and prevent more health problems from developing. Treatment varies depending on:  Your symptoms.  Whether you want to have a baby or whether you need birth control (contraception). Treatment may include nutrition and lifestyle changes along with:  Progesterone hormone to start a menstrual period.  Birth control pills to help you have regular menstrual periods.  Medicines to make you ovulate, if you want to get pregnant.  Medicine to reduce excessive hair growth.  Surgery, in severe cases. This may involve making small holes in one or both of your ovaries. This decreases the amount of testosterone that your body produces. Follow these instructions at home:  Take over-the-counter and prescription medicines only as told by your health care provider.  Follow a healthy meal plan. This can help you reduce the effects of PCOS. ? Eat a healthy diet that includes lean proteins, complex carbohydrates, fresh fruits and vegetables, low-fat dairy products, and healthy fats. Make sure to eat enough fiber.  If you are overweight, lose weight as told by your health care provider. ? Losing 10% of your body weight may improve symptoms. ? Your health care provider can determine how much weight loss is best for you and can help you lose weight safely.  Keep all follow-up visits as told by your health care provider.   This is important. Contact a health care provider if:  Your symptoms do not get better with medicine.  You develop new symptoms. This information is not intended to replace advice given to you by your health care provider. Make sure you discuss any questions you have with your health care provider. Document  Revised: 05/28/2017 Document Reviewed: 12/01/2015 Elsevier Patient Education  2020 [employer]  

## 2020-02-28 ENCOUNTER — Telehealth: Payer: Self-pay | Admitting: *Deleted

## 2020-02-28 NOTE — Telephone Encounter (Signed)
BD#[id number] Rx Trokendi Xr 500mg  was send on 02/28/2020. Awaiting for determination form the plan

## 2020-02-29 NOTE — Telephone Encounter (Signed)
Appro[place] September 1 Request Reference Number: [id number]. TROKENDI XR CAP 50MG  is approved through 02/27/2021. Your patient may now fill this prescription and it will be covered.  LVM to [facility] pharmacy with approval information

## 2020-03-06 ENCOUNTER — Ambulatory Visit: Payer: 59 | Admitting: Physician Assistant

## 2020-03-06 ENCOUNTER — Other Ambulatory Visit: Payer: Self-pay

## 2020-03-06 ENCOUNTER — Encounter: Payer: Self-pay | Admitting: Physician Assistant

## 2020-03-06 VITALS — BP 120/84 | HR 86 | Temp 98.1°F | Ht 64.0 in | Wt 250.0 lb

## 2020-03-06 DIAGNOSIS — E282 Polycystic ovarian syndrome: Secondary | ICD-10-CM | POA: Diagnosis not present

## 2020-03-06 DIAGNOSIS — I10 Essential (primary) hypertension: Secondary | ICD-10-CM | POA: Diagnosis not present

## 2020-03-06 DIAGNOSIS — E538 Deficiency of other specified B group vitamins: Secondary | ICD-10-CM

## 2020-03-06 MED ORDER — PHENTERMINE HCL 37.5 MG PO TABS: 37.5000 mg | ORAL_TABLET | Freq: Every day | ORAL | 2 refills | Status: DC

## 2020-03-06 NOTE — Progress Notes (Signed)
Brandi Navarro is a 34 y.o. female is here for 3 month follow up.  I acted as a Neurosurgeon for [employer], PA-C Corky Mull, LPN   History of Present Illness:   Chief Complaint  Patient presents with  . Hypertension  . Obesity    HPI   Hypertension Pt currently taking Metoprolol XR 25 mg daily. She is not checking blood pressure at home. Pt denies headaches, dizziness, blurred vision, chest pain, SOB or lower leg edema. Denies excessive caffeine intake, stimulant usage, excessive alcohol intake or increase in salt consumption.  BP Readings from Last 3 Encounters:  03/06/20 120/84  02/20/20 118/64  02/14/20 136/88   B12 deficiency She has history of B12 deficiency and would like this checked today. Has done B12 injections in the past.   Obesity Pt following up, last visit Phentermine was increased to full tablet 37.5 mg daily, also taking Trokendi XR 50 mg daily. Eye twitching is the same, no worse Denies unusal headaches or vision changes. She was recently diagnosed with PCOS by her ob-gyn.  Wt Readings from Last 10 Encounters:  03/06/20 250 lb (113.4 kg)  02/20/20 253 lb (114.8 kg)  02/14/20 253 lb (114.8 kg)  12/05/19 267 lb 6.4 oz (121.3 kg)  11/03/19 272 lb 3.2 oz (123.5 kg)  06/05/19 258 lb (117 kg)  01/11/19 255 lb (115.7 kg)  10/11/18 237 lb (107.5 kg)  06/30/18 217 lb (98.4 kg)  06/17/18 239 lb 6.4 oz (108.6 kg)   Body mass index is 42.91 kg/m.   There are no preventive care reminders to display for this patient.  Past Medical History:  Diagnosis Date  . Asthma    well controlled; never on scheduled inhalers  . HSV2   . Hypertension   . Migraines with aura      Social History   Tobacco Use  . Smoking status: Former Smoker    Types: E-cigarettes  . Smokeless tobacco: Never Used  Vaping Use  . Vaping Use: Never used  Substance Use Topics  . Alcohol use: Yes    Alcohol/week: 2.0 - 3.0 standard drinks    Types: 2 - 3 Standard drinks or  equivalent per week  . Drug use: Yes    Types: Marijuana    Comment: daily    Past Surgical History:  Procedure Laterality Date  . TONSILLECTOMY      Family History  Problem Relation Age of Onset  . Cancer Other   . Diabetes Maternal Grandfather   . Hypertension Maternal Grandfather   . Anemia Mother   . Anemia Sister   . Anemia Maternal Grandmother     PMHx, SurgHx, SocialHx, FamHx, Medications, and Allergies were reviewed in the Visit Navigator and updated as appropriate.   Patient Active Problem List   Diagnosis Date Noted  . Recurrent tonsillitis 09/12/2018  . Morbid obesity (HCC) 03/22/2018  . 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 Migraine with aura and without status migrainosus, not intractable 12/19/2017  . PMDD (premenstrual dysphoric disorder) 12/19/2017  . Seasonal allergic rhinitis due to pollen 12/19/2017  . Essential hypertension 12/19/2017  . Marijuana user 12/19/2017  . Pure hypercholesterolemia 12/19/2017    Social History   Tobacco Use  . Smoking status: Former Smoker    Types: E-cigarettes  . Smokeless tobacco: Never Used  Vaping Use  . Vaping Use: Never used  Substance Use Topics  . Alcohol use: Yes    Alcohol/week: 2.0 - 3.0 standard drinks  Types: 2 - 3 Standard drinks or equivalent per week  . Drug use: Yes    Types: Marijuana    Comment: daily    Current Medications and Allergies:    Current Outpatient Medications:  .  citalopram (CELEXA) 20 MG tablet, TAKE 1 TABLET BY MOUTH ONCE A DAY,   avoid Ibuprofen with this medication, may cause GI bleeding, Disp: 90 tablet, Rfl: 4 .  IBUPROFEN PO, Take by mouth., Disp: , Rfl:  .  loratadine (CLARITIN) 10 MG tablet, Take 10 mg by mouth daily., Disp: , Rfl:  .  metoprolol succinate (TOPROL-XL) 25 MG 24 hr tablet, Take 1 tablet (25 mg total) by mouth daily., Disp: 90 tablet, Rfl: 3 .  Multiple Vitamins-Minerals (MULTIVITAMIN PO), Take by mouth daily., Disp: , Rfl:  .   Topiramate ER (TROKENDI XR) 50 MG CP24, Take 1 capsule by mouth daily., Disp: 90 capsule, Rfl: 2 .  valACYclovir (VALTREX) 500 MG tablet, One daily for suppression, increase to two daily at outbreak for 3 days (Patient taking differently: Take 500 mg by mouth daily. One daily for suppression, increase to two daily at outbreak for 3 days), Disp: 90 tablet, Rfl: 4 .  norethindrone (MICRONOR) 0.35 MG tablet, Take 1 tablet (0.35 mg total) by mouth daily. (Patient not taking: Reported on 03/06/2020), Disp: 3 tablet, Rfl: 3 .  phentermine (ADIPEX-P) 37.5 MG tablet, Take 1 tablet (37.5 mg total) by mouth daily before breakfast., Disp: 30 tablet, Rfl: 2  No Known Allergies  Review of Systems   ROS  Negative unless otherwise specified per HPI.  Vitals:   Vitals:   03/06/20 1443  BP: 120/84  Pulse: 86  Temp: 98.1 F (36.7 C)  TempSrc: Temporal  SpO2: 96%  Weight: 250 lb (113.4 kg)  Height: 5\' 4"  (1.626 m)     Body mass index is 42.91 kg/m.   Physical Exam:    Physical Exam Vitals and nursing note reviewed.  Constitutional:      General: She is not in acute distress.    Appearance: She is well-developed. She is not ill-appearing or toxic-appearing.  Cardiovascular:     Rate and Rhythm: Normal rate and regular rhythm.     Pulses: Normal pulses.     Heart sounds: Normal heart sounds, S1 normal and S2 normal.     Comments: No LE edema Pulmonary:     Effort: Pulmonary effort is normal.     Breath sounds: Normal breath sounds.  Skin:    General: Skin is warm and dry.  Neurological:     Mental Status: She is alert.     GCS: GCS eye subscore is 4. GCS verbal subscore is 5. GCS motor subscore is 6.  Psychiatric:        Speech: Speech normal.        Behavior: Behavior normal. Behavior is cooperative.      Assessment and Plan:    [name] was seen today for hypertension and obesity.  Diagnoses and all orders for this visit:  B12 deficiency Will update B12 level today. If level  <400, will start B12 injections. -     Vitamin B12; Future -     Vitamin B12  PCOS (polycystic ovarian syndrome); Morbid obesity (HCC) Continue Trokendi and Phentermine for now -- doing well and losing weight with this Reviewed in detail today.  She is planning to start POPs soon. She has concerns about worsening cystic acne. Will trial low dose spiro to see if this helps  her symptoms and have her follow-up in 1 month. Consider checking fasting insulin and starting GLP-1 -     Comprehensive metabolic panel; Future -     Comprehensive metabolic panel  Essential hypertension Currently well controlled. Continue regimen.  Other orders -     phentermine (ADIPEX-P) 37.5 MG tablet; Take 1 tablet (37.5 mg total) by mouth daily before breakfast.    CMA or LPN served as scribe during this visit. History, Physical, and Plan performed by medical provider. The above documentation has been reviewed and is accurate and complete.   Jarold Motto, PA-C , [facility] 03/06/2020  Follow-up: No follow-ups on file.

## 2020-03-06 NOTE — Patient Instructions (Addendum)
It was great to see you!  Continue phentermine and trokendi.  Let's recheck your liver function tests and update your B12 today.  After I get your labs I will send in spironolactone for you to start.  Let's follow-up in 1 month after starting spironolactone so we can see how you are doing with this and recheck your potassium level while on this medication. At that point we can discuss checking your insulin levels -- we could consider weekly Ozempic if your levels are elevated. (This medication is the same as Reginal Lutes, that was recently pulled from the market because they couldn't keep it on the shelves!)  Take care,  Jarold Motto PA-C

## 2020-03-07 ENCOUNTER — Encounter: Payer: Self-pay | Admitting: Physician Assistant

## 2020-03-07 ENCOUNTER — Other Ambulatory Visit: Payer: Self-pay | Admitting: Physician Assistant

## 2020-03-07 LAB — COMPREHENSIVE METABOLIC PANEL
AG Ratio: 1.9 (calc) (ref 1.0–2.5)
ALT: 50 U/L — ABNORMAL HIGH (ref 6–29)
AST: 38 U/L — ABNORMAL HIGH (ref 10–30)
Albumin: 4.5 g/dL (ref 3.6–5.1)
Alkaline phosphatase (APISO): 107 U/L (ref 31–125)
BUN: 21 mg/dL (ref 7–25)
CO2: 24 mmol/L (ref 20–32)
Calcium: 9.9 mg/dL (ref 8.6–10.2)
Chloride: 108 mmol/L (ref 98–110)
Creat: 0.99 mg/dL (ref 0.50–1.10)
Globulin: 2.4 g/dL (calc) (ref 1.9–3.7)
Glucose, Bld: 98 mg/dL (ref 65–99)
Potassium: 3.7 mmol/L (ref 3.5–5.3)
Sodium: 139 mmol/L (ref 135–146)
Total Bilirubin: 0.4 mg/dL (ref 0.2–1.2)
Total Protein: 6.9 g/dL (ref 6.1–8.1)

## 2020-03-07 LAB — VITAMIN B12: Vitamin B-12: 563 pg/mL (ref 200–1100)

## 2020-03-07 MED ORDER — SPIRONOLACTONE 25 MG PO TABS: 25.0000 mg | ORAL_TABLET | Freq: Every day | ORAL | 1 refills | Status: DC

## 2020-04-05 ENCOUNTER — Ambulatory Visit: Payer: 59 | Admitting: Physician Assistant

## 2020-04-08 ENCOUNTER — Other Ambulatory Visit: Payer: Self-pay

## 2020-04-08 ENCOUNTER — Encounter: Payer: Self-pay | Admitting: Physician Assistant

## 2020-04-08 ENCOUNTER — Ambulatory Visit: Payer: 59 | Admitting: Physician Assistant

## 2020-04-08 VITALS — BP 126/90 | HR 78 | Temp 97.7°F | Ht 64.0 in | Wt 243.0 lb

## 2020-04-08 DIAGNOSIS — F419 Anxiety disorder, unspecified: Secondary | ICD-10-CM

## 2020-04-08 DIAGNOSIS — L700 Acne vulgaris: Secondary | ICD-10-CM | POA: Diagnosis not present

## 2020-04-08 DIAGNOSIS — R7989 Other specified abnormal findings of blood chemistry: Secondary | ICD-10-CM

## 2020-04-08 LAB — HEPATIC FUNCTION PANEL
AG Ratio: 1.9 (calc) (ref 1.0–2.5)
ALT: 31 U/L — ABNORMAL HIGH (ref 6–29)
AST: 20 U/L (ref 10–30)
Albumin: 4.3 g/dL (ref 3.6–5.1)
Alkaline phosphatase (APISO): 93 U/L (ref 31–125)
Bilirubin, Direct: 0 mg/dL (ref 0.0–0.2)
Globulin: 2.3 g/dL (calc) (ref 1.9–3.7)
Indirect Bilirubin: 0.3 mg/dL (calc) (ref 0.2–1.2)
Total Bilirubin: 0.3 mg/dL (ref 0.2–1.2)
Total Protein: 6.6 g/dL (ref 6.1–8.1)

## 2020-04-08 MED ORDER — LORAZEPAM 0.5 MG PO TABS: 0.5000 mg | ORAL_TABLET | Freq: Every day | ORAL | 0 refills | Status: AC

## 2020-04-08 MED ORDER — SPIRONOLACTONE 25 MG PO TABS: 25.0000 mg | ORAL_TABLET | Freq: Two times a day (BID) | ORAL | 1 refills | Status: DC

## 2020-04-08 NOTE — Patient Instructions (Signed)
It was great to see you!  Increase spironolactone to 25 mg twice daily.  Follow-up with Gertie Exon in a couple months to check in on your periods.  Update your liver function tests today.  Ativan as needed for anxiety.  Let's follow-up in about 52-months.  Take care,  Jarold Motto PA-C

## 2020-04-08 NOTE — Progress Notes (Signed)
Brandi Navarro is a 34 y.o. female is here for follow up.  I acted as a Neurosurgeon for [employer], PA-C Brandi Mull, LPN   History of Present Illness:   Chief Complaint  Patient presents with  . Acne    HPI   Acne Pt here for follow up on her acne, pt was started on Spironolactone 25 mg daily last visit. Pt has had a few more outbreaks since here last, some on her face and back.  Wt Readings from Last 5 Encounters:  04/08/20 243 lb (110.2 kg)  03/06/20 250 lb (113.4 kg)  02/20/20 253 lb (114.8 kg)  02/14/20 253 lb (114.8 kg)  12/05/19 267 lb 6.4 oz (121.3 kg)   September 25th started micronor. Has had very irregular bleeding since that time. She uses Cetaphil for her cleansers.  Anxiety Has significantly reduced drinking to help with recently discovered elevated LFTs and also weight. She has been doing well but realizes that her anxiety can be uncontrolled at times. She has used xanax in the past with partial relief (can be overly sedating.) Denies SI/HI.  Elevated LFTs Found on last lab draw. She has significantly reduced alcohol since then. Due for repeat labs today. Denies unusual abdominal pain.   There are no preventive care reminders to display for this patient.  Past Medical History:  Diagnosis Date  . Asthma    well controlled; never on scheduled inhalers  . HSV2   . Hypertension   . Migraines with aura      Social History   Tobacco Use  . Smoking status: Former Smoker    Types: E-cigarettes  . Smokeless tobacco: Never Used  Vaping Use  . Vaping Use: Never used  Substance Use Topics  . Alcohol use: Yes    Alcohol/week: 2.0 - 3.0 standard drinks    Types: 2 - 3 Standard drinks or equivalent per week  . Drug use: Yes    Types: Marijuana    Comment: daily    Past Surgical History:  Procedure Laterality Date  . TONSILLECTOMY      Family History  Problem Relation Age of Onset  . Cancer Other   . Diabetes Maternal Grandfather   .  Hypertension Maternal Grandfather   . Anemia Mother   . Anemia Sister   . Anemia Maternal Grandmother     PMHx, SurgHx, SocialHx, FamHx, Medications, and Allergies were reviewed in the Visit Navigator and updated as appropriate.   Patient Active Problem List   Diagnosis Date Noted  . Recurrent tonsillitis 09/12/2018  . Morbid obesity (HCC) 03/22/2018  . 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 Migraine with aura and without status migrainosus, not intractable 12/19/2017  . PMDD (premenstrual dysphoric disorder) 12/19/2017  . Seasonal allergic rhinitis due to pollen 12/19/2017  . Essential hypertension 12/19/2017  . Marijuana user 12/19/2017  . Pure hypercholesterolemia 12/19/2017    Social History   Tobacco Use  . Smoking status: Former Smoker    Types: E-cigarettes  . Smokeless tobacco: Never Used  Vaping Use  . Vaping Use: Never used  Substance Use Topics  . Alcohol use: Yes    Alcohol/week: 2.0 - 3.0 standard drinks    Types: 2 - 3 Standard drinks or equivalent per week  . Drug use: Yes    Types: Marijuana    Comment: daily    Current Medications and Allergies:    Current Outpatient Medications:  .  citalopram (CELEXA) 20 MG tablet, TAKE 1  TABLET BY MOUTH ONCE A DAY,   avoid Ibuprofen with this medication, may cause GI bleeding, Disp: 90 tablet, Rfl: 4 .  IBUPROFEN PO, Take by mouth., Disp: , Rfl:  .  loratadine (CLARITIN) 10 MG tablet, Take 10 mg by mouth daily., Disp: , Rfl:  .  metoprolol succinate (TOPROL-XL) 25 MG 24 hr tablet, Take 1 tablet (25 mg total) by mouth daily., Disp: 90 tablet, Rfl: 3 .  Multiple Vitamins-Minerals (MULTIVITAMIN PO), Take by mouth daily., Disp: , Rfl:  .  norethindrone (MICRONOR) 0.35 MG tablet, Take 1 tablet (0.35 mg total) by mouth daily., Disp: 3 tablet, Rfl: 3 .  spironolactone (ALDACTONE) 25 MG tablet, Take 1 tablet (25 mg total) by mouth 2 (two) times daily., Disp: 60 tablet, Rfl: 1 .  Topiramate ER (TROKENDI XR)  50 MG CP24, Take 1 capsule by mouth daily., Disp: 90 capsule, Rfl: 2 .  valACYclovir (VALTREX) 500 MG tablet, One daily for suppression, increase to two daily at outbreak for 3 days (Patient taking differently: Take 500 mg by mouth daily. One daily for suppression, increase to two daily at outbreak for 3 days), Disp: 90 tablet, Rfl: 4 .  LORazepam (ATIVAN) 0.5 MG tablet, Take 1 tablet (0.5 mg total) by mouth at bedtime., Disp: 30 tablet, Rfl: 0 .  phentermine (ADIPEX-P) 37.5 MG tablet, Take 1 tablet (37.5 mg total) by mouth daily before breakfast., Disp: 30 tablet, Rfl: 2  No Known Allergies  Review of Systems   ROS Negative unless otherwise specified per HPI.  Vitals:   Vitals:   04/08/20 1534  BP: 126/90  Pulse: 78  Temp: 97.7 F (36.5 C)  TempSrc: Temporal  SpO2: 98%  Weight: 243 lb (110.2 kg)  Height: 5\' 4"  (1.626 m)     Body mass index is 41.71 kg/m.   Physical Exam:    Physical Exam Vitals and nursing note reviewed.  Constitutional:      General: She is not in acute distress.    Appearance: She is well-developed. She is not ill-appearing or toxic-appearing.  Cardiovascular:     Rate and Rhythm: Normal rate and regular rhythm.     Pulses: Normal pulses.     Heart sounds: Normal heart sounds, S1 normal and S2 normal.     Comments: No LE edema Pulmonary:     Effort: Pulmonary effort is normal.     Breath sounds: Normal breath sounds.  Skin:    General: Skin is warm and dry.  Neurological:     Mental Status: She is alert.     GCS: GCS eye subscore is 4. GCS verbal subscore is 5. GCS motor subscore is 6.  Psychiatric:        Speech: Speech normal.        Behavior: Behavior normal. Behavior is cooperative.      Assessment and Plan:    Brandi Navarro was seen today for acne.  Diagnoses and all orders for this visit:  Elevated LFTs Update LFTs today. If remains elevated, will pursue abdominal u/s however I think her drinking was likely the culprit so I anticipate  better numbers. -     Cancel: Hepatic function panel; Future -     Cancel: Hepatic function panel -     Hepatic function panel; Future -     Hepatic function panel  Acne vulgaris Will increase spironolactone to 25 mg BID. Follow-up in 2-3 months, sooner if concerns. Offered oral abx to help with cystic acne but she declined.  Anxiety Would like prn medication. Discussed options. Will trial low dose use of ativan. Recommend that she take sparingly and if she is taking more than 2-3 times a week to let us know so we can switch to better long-term regimen.  Other orders -     spironolactone (ALDACTONE) 25 MG tablet; Take 1 tablet (25 mg total) by mouth 2 (two) times daily. -     LORazepam (ATIVAN) 0.5 MG tablet; Take 1 tablet (0.5 mg total) by mouth at bedtime.      CMA or LPN served as scribe during this visit. History, Physical, and Plan performed by medical provider. The above documentation has been reviewed and is accurate and complete.   Jarold Motto, PA-C , [facility] 04/08/2020  Follow-up: No follow-ups on file.

## 2020-06-09 ENCOUNTER — Other Ambulatory Visit: Payer: Self-pay | Admitting: Physician Assistant

## 2020-06-11 NOTE — Telephone Encounter (Signed)
Pt requesting Phentermine 37.5mg  tab LOV: 03/29/2020 No future visits scheduled Last refill: 05/12/2020  Approve?

## 2020-07-10 ENCOUNTER — Encounter: Payer: Self-pay | Admitting: Obstetrics and Gynecology

## 2020-07-11 ENCOUNTER — Other Ambulatory Visit: Payer: Self-pay | Admitting: Physician Assistant

## 2020-07-17 ENCOUNTER — Encounter: Payer: Self-pay | Admitting: Obstetrics and Gynecology

## 2020-07-18 ENCOUNTER — Telehealth: Payer: Self-pay | Admitting: Obstetrics and Gynecology

## 2020-07-18 NOTE — Telephone Encounter (Signed)
The following mychart message was sent in response to patient message.   Hi Oliva, I'm sorry about the original delay in response. I can understand your frustration. It is not recommended to treat BV over the phone. While you may very well have BV, women often misdiagnose themselves. BV typically occurs in women who are sexually active and can be recurrent. If you were to have 3 infections in a year I would put you on 6 months of antibiotic suppression. We don't ever want to do that without having 3 documented infections. Antibiotics are not without risk and shouldn't be prescribed unless necessary.  If you ever have trouble reaching the office through mychart, please call for a quicker response. These messages don't come directly to me but go to the triage nurses for assessment. I'm not sure what caused the delay in receiving your message, but my office staff will look into it. Thank you for alerting [name] about your experience.  Please let us know if you would like to come in. Best, Gertie Exon

## 2020-08-11 ENCOUNTER — Other Ambulatory Visit: Payer: Self-pay | Admitting: Physician Assistant

## 2020-08-11 DIAGNOSIS — G43109 Migraine with aura, not intractable, without status migrainosus: Secondary | ICD-10-CM

## 2020-08-12 NOTE — Telephone Encounter (Signed)
Phentermine last rx 07/12/20 #30 LOV: 04/08/20

## 2020-09-21 ENCOUNTER — Other Ambulatory Visit: Payer: Self-pay | Admitting: Physician Assistant

## 2020-09-23 ENCOUNTER — Telehealth: Payer: Self-pay | Admitting: *Deleted

## 2020-09-23 NOTE — Telephone Encounter (Signed)
Tried to contact pt, no answer. Voicemail not set up unable to leave message.

## 2020-09-23 NOTE — Telephone Encounter (Signed)
Must follow-up for any further refills.

## 2020-11-11 ENCOUNTER — Other Ambulatory Visit: Payer: Self-pay | Admitting: Physician Assistant

## 2020-11-11 DIAGNOSIS — G43109 Migraine with aura, not intractable, without status migrainosus: Secondary | ICD-10-CM

## 2020-11-12 NOTE — Telephone Encounter (Signed)
Pt requesting refills for Phentermine 37.5 mg and Trokendi XR 50 mg. Last OV 04/2020 with Samantha.

## 2020-11-26 ENCOUNTER — Telehealth: Payer: Self-pay

## 2020-11-26 DIAGNOSIS — I10 Essential (primary) hypertension: Secondary | ICD-10-CM

## 2020-11-26 MED ORDER — METOPROLOL SUCCINATE ER 25 MG PO TB24: 25.0000 mg | ORAL_TABLET | Freq: Every day | ORAL | 0 refills | Status: DC

## 2020-11-26 NOTE — Telephone Encounter (Signed)
.   LAST APPOINTMENT DATE: 11/11/2020   NEXT APPOINTMENT DATE:@Visit  date not found  MEDICATION:metoprolol succinate (TOPROL-XL) 25 MG 24 hr tablet   PHARMACY:[facility] [address] [place], [place] - [address]

## 2020-11-26 NOTE — Telephone Encounter (Signed)
Tried to contact pt voicemail box is full unable to leave message. Rx was sent to pharmacy.

## 2020-12-23 ENCOUNTER — Other Ambulatory Visit: Payer: Self-pay | Admitting: Family Medicine

## 2021-01-08 ENCOUNTER — Telehealth: Payer: Self-pay

## 2021-01-08 NOTE — Telephone Encounter (Signed)
Patient is scheduled for 01/09/21   Caller states she has high blood pressure with headache and dizziness. BP was 147/107 this morning. Translation No Nurse Assessment Nurse: Emilee Hero, RN, Bjorn Loser Date/Time (Eastern Time): 01/07/2021 2:10:21 PM Confirm and document reason for call. If symptomatic, describe symptoms. ---Caller states she has high blood pressure with headache and dizziness. BP was 147/107 this morning. Does the patient have any new or worsening symptoms? ---Yes Will a triage be completed? ---Yes Related visit to physician within the last 2 weeks? ---No Does the PT have any chronic conditions? (i.e. diabetes, asthma, this includes High risk factors for pregnancy, etc.) ---Yes List chronic conditions. ---HTN; insulin resistant; PCOS Is the patient pregnant or possibly pregnant? (Ask all females between the ages of 37-55) ---No Is this a behavioral health or substance abuse call? ---No Guidelines Guideline Title Affirmed Question Affirmed Notes Nurse Date/Time (Eastern Time) Blood Pressure - High Systolic BP >= 160 OR Diastolic >= 100 Emilee Hero, RN, Bjorn Loser 01/07/2021 2:13:35 PM Disp. Time Lamount Cohen Time) Disposition Final User PLEASE NOTE: All timestamps contained within this report are represented as [place] Standard Time. CONFIDENTIALTY NOTICE: This fax transmission is intended only for the addressee. It contains information that is legally privileged, confidential or otherwise protected from use or disclosure. If you are not the intended recipient, you are strictly prohibited from reviewing, disclosing, copying using or disseminating any of this information or taking any action in reliance on or regarding this information. If you have received this fax in error, please notify us immediately by telephone so that we can arrange for its return to [name]. Phone: [phone], Toll-Free: [phone], Fax: [phone] Page: 2 of 2 Call Id: [id number] 01/07/2021 2:20:49 PM SEE PCP  WITHIN 3 DAYS Yes Emilee Hero, RN, Bjorn Loser

## 2021-01-08 NOTE — Telephone Encounter (Signed)
FYI, pt scheduled to see you tomorrow. No appointments today.

## 2021-01-09 ENCOUNTER — Encounter: Payer: Self-pay | Admitting: Physician Assistant

## 2021-01-09 ENCOUNTER — Other Ambulatory Visit: Payer: Self-pay

## 2021-01-09 ENCOUNTER — Ambulatory Visit: Payer: No Typology Code available for payment source | Admitting: Physician Assistant

## 2021-01-09 VITALS — BP 118/82 | HR 84 | Temp 97.9°F | Ht 64.0 in | Wt 236.2 lb

## 2021-01-09 DIAGNOSIS — I10 Essential (primary) hypertension: Secondary | ICD-10-CM | POA: Diagnosis not present

## 2021-01-09 DIAGNOSIS — F4329 Adjustment disorder with other symptoms: Secondary | ICD-10-CM

## 2021-01-09 NOTE — Progress Notes (Signed)
Acute Office Visit  Subjective:    Patient ID: Brandi Navarro, female    DOB: 04/11/1986, 35 y.o.   MRN: [id number]  Chief Complaint  Patient presents with   Hypertension    Hypertension  Patient is in today for elevated BP readings and intermittent dizzy episodes. Two days ago BP reading was 147/107. Sugar readings have been normal (not diabetic, but has PCOS and checks occasionally). No CP or SOB. No palpitations. No syncope. No recent illness. No change in current medications. She does take Toprol XL 25 mg daily for HTN.   In the last week her sister-in-law gave birth to 4th child (patient's brother died in 2023-04-30 and did not know wife was pregnant), pt started period, and father had a heart attack. Pt also works for [employer], sometimes 6 days/ week.  Stress outlet is her new puppy and 75 yo dog.    Past Medical History:  Diagnosis Date   Asthma    well controlled; never on scheduled inhalers   HSV2    Hypertension    Migraines with aura     Past Surgical History:  Procedure Laterality Date   TONSILLECTOMY      Family History  Problem Relation Age of Onset   Cancer Other    Diabetes Maternal Grandfather    Hypertension Maternal Grandfather    Anemia Mother    Anemia Sister    Anemia Maternal Grandmother     Social History   Socioeconomic History   Marital status: Single    Spouse name: Not on file   Number of children: Not on file   Years of education: Not on file   Highest education level: Not on file  Occupational History    Employer: [employer]  Tobacco Use   Smoking status: Former    Types: E-cigarettes   Smokeless tobacco: Never  Vaping Use   Vaping Use: Never used  Substance and Sexual Activity   Alcohol use: Yes    Alcohol/week: 2.0 - 3.0 standard drinks    Types: 2 - 3 Standard drinks or equivalent per week   Drug use: Yes    Types: Marijuana    Comment: daily   Sexual activity: Yes    Partners: Male    Birth  control/protection: Condom    Comment: condoms occ  Other Topics Concern   Not on file  Social History Narrative   Works at [employer] -- almost 5 years   No kids   Single   Social Determinants of Corporate investment banker Strain: Not on file  Food Insecurity: Not on file  Transportation Needs: Not on file  Physical Activity: Not on file  Stress: Not on file  Social Connections: Not on file  Intimate Partner Violence: Not on file    Outpatient Medications Prior to Visit  Medication Sig Dispense Refill   citalopram (CELEXA) 20 MG tablet TAKE 1 TABLET BY MOUTH ONCE A DAY,   avoid Ibuprofen with this medication, may cause GI bleeding 90 tablet 4   IBUPROFEN PO Take by mouth.     loratadine (CLARITIN) 10 MG tablet Take 10 mg by mouth daily.     LORazepam (ATIVAN) 0.5 MG tablet Take 1 tablet (0.5 mg total) by mouth at bedtime. 30 tablet 0   metoprolol succinate (TOPROL-XL) 25 MG 24 hr tablet Take 1 tablet (25 mg total) by mouth daily. 90 tablet 0   Multiple Vitamins-Minerals (MULTIVITAMIN PO) Take by mouth daily.  phentermine (ADIPEX-P) 37.5 MG tablet TAKE 1 TABLET BY MOUTH ONCE DAILY BEFORE BREAKFAST 30 tablet 0   TROKENDI XR 50 MG CP24 Take 1 capsule by mouth once daily 90 capsule 0   norethindrone (MICRONOR) 0.35 MG tablet Take 1 tablet (0.35 mg total) by mouth daily. 3 tablet 3   spironolactone (ALDACTONE) 25 MG tablet Take 1 tablet by mouth twice daily 60 tablet 0   valACYclovir (VALTREX) 500 MG tablet One daily for suppression, increase to two daily at outbreak for 3 days (Patient taking differently: Take 500 mg by mouth daily. One daily for suppression, increase to two daily at outbreak for 3 days) 90 tablet 4   No facility-administered medications prior to visit.    No Known Allergies  Review of Systems  All other systems reviewed and are negative. REFER TO HPI FOR PERTINENT POSITIVES AND NEGATIVES     Objective:    Physical Exam Vitals and nursing note  reviewed.  Constitutional:      Appearance: Normal appearance. She is obese. She is not toxic-appearing.  HENT:     Head: Normocephalic and atraumatic.     Right Ear: External ear normal.     Left Ear: External ear normal.     Nose: Nose normal.     Mouth/Throat:     Mouth: Mucous membranes are moist.  Eyes:     Extraocular Movements: Extraocular movements intact.     Conjunctiva/sclera: Conjunctivae normal.     Pupils: Pupils are equal, round, and reactive to light.  Cardiovascular:     Rate and Rhythm: Normal rate and regular rhythm.     Pulses: Normal pulses.     Heart sounds: Normal heart sounds.  Pulmonary:     Effort: Pulmonary effort is normal.     Breath sounds: Normal breath sounds.  Musculoskeletal:        General: Normal range of motion.     Cervical back: Normal range of motion and neck supple.  Skin:    General: Skin is warm and dry.  Neurological:     General: No focal deficit present.     Mental Status: She is alert and oriented to person, place, and time.  Psychiatric:        Behavior: Behavior normal.        Thought Content: Thought content normal.        Judgment: Judgment normal.     Comments: TEARFUL DISCUSSING BROTHER'S PASSING AND BIRTH OF HIS NEW SON    BP 118/82   Pulse 84   Temp 97.9 F (36.6 C)   Ht 5\' 4"  (1.626 m)   Wt 236 lb 3.2 oz (107.1 kg)   LMP 01/07/2021   SpO2 97%   BMI 40.54 kg/m  Wt Readings from Last 3 Encounters:  01/09/21 236 lb 3.2 oz (107.1 kg)  04/08/20 243 lb (110.2 kg)  03/06/20 250 lb (113.4 kg)    There are no preventive care reminders to display for this patient.  There are no preventive care reminders to display for this patient.   Lab Results  Component Value Date   TSH 4.100 02/14/2020   Lab Results  Component Value Date   WBC 9.5 02/14/2020   HGB 14.1 02/14/2020   HCT 40.7 02/14/2020   MCV 94 02/14/2020   PLT 355 02/14/2020   Lab Results  Component Value Date   NA 139 03/06/2020   K 3.7  03/06/2020   CO2 24 03/06/2020   GLUCOSE 98 03/06/2020  BUN 21 03/06/2020   CREATININE 0.99 03/06/2020   BILITOT 0.3 04/08/2020   ALKPHOS 134 (H) 02/14/2020   AST 20 04/08/2020   ALT 31 (H) 04/08/2020   PROT 6.6 04/08/2020   ALBUMIN 4.6 02/14/2020   CALCIUM 9.9 03/06/2020   GFR 90.79 12/20/2017   Lab Results  Component Value Date   CHOL 246 (H) 02/14/2020   Lab Results  Component Value Date   HDL 45 02/14/2020   Lab Results  Component Value Date   LDLCALC 162 (H) 02/14/2020   Lab Results  Component Value Date   TRIG 209 (H) 02/14/2020   Lab Results  Component Value Date   CHOLHDL 5.5 (H) 02/14/2020   Lab Results  Component Value Date   HGBA1C 5.3 02/14/2020       Assessment & Plan:   Problem List Items Addressed This Visit   None   1. Essential hypertension 2. Stress and adjustment reaction Discussed with pt that she has been through a lot of changes and trauma in the last year. She does take Celexa 20 mg daily, may want to consider increase +/- counseling. Her BP is normal in office and she is feeling somewhat better today. Continue the Toprol XL 25 mg daily. She will start monitoring BP at home and call if consistently over 130/80. Strongly advised stress-outlets for her, maybe consider taking time off work. She will let me know how she is doing.   Chandra Asher M Nafeesah Lapaglia, PA-C

## 2021-01-09 NOTE — Patient Instructions (Signed)
Your blood pressure is normal today. Please get a cuff for at home and start tracking every other day. Recheck if readings consistently over 130/80. Consider counseling. Get outside and walk daily. Drink plenty of water, at least 80 ounces daily - you've been doing good with this.

## 2021-01-31 ENCOUNTER — Telehealth: Payer: Self-pay | Admitting: *Deleted

## 2021-01-31 DIAGNOSIS — G43109 Migraine with aura, not intractable, without status migrainosus: Secondary | ICD-10-CM

## 2021-01-31 MED ORDER — TROKENDI XR 50 MG PO CP24: 1.0000 | ORAL_CAPSULE | Freq: Every day | ORAL | 0 refills | Status: DC

## 2021-01-31 NOTE — Telephone Encounter (Signed)
Received fax from pharmacy PA needed for Trokendi XR 50 mg capsule. Started PA through [employer]: [id number] then said need to contact insurance.  Called insurance at [id number] and spoke to [place] she said no PA is required pharmacy can fill Rx. Told her okay.  [facility] pharmacy and spoke to [place] told her PA is not needed for Trokendi XR per insurance. Mindi Junker verbalized understanding. She said pt does not have a recent Rx on file but is time for a refill. Told her okay I will check with pt.   Spoke to pt asked if she needed a refill on her Trokendi XR. Pt said yes. Told her okay will send new Rx, verified pharmacy and told her no PA is needed. PT verbalized understanding. Rx sent.

## 2021-02-02 ENCOUNTER — Other Ambulatory Visit: Payer: Self-pay | Admitting: Family Medicine

## 2021-02-03 NOTE — Telephone Encounter (Signed)
Patient needs appointment for refill of medications

## 2021-02-18 NOTE — Progress Notes (Addendum)
35 y.o. G0P0000 Single White or Caucasian Not Hispanic or Latino female here for annual exam.   Period Cycle (Days): 28 Period Duration (Days): 5-10 Period Pattern: Regular Menstrual Flow: Heavy Menstrual Control: Tampon Menstrual Control Change Freq (Hours): 1.5-2 Dysmenorrhea: (!) Severe Dysmenorrhea Symptoms: Cramping, Nausea, Diarrhea, Headache  She was evaluated in 8/21 for menometrorrhagia. Normal CBC, TSH, prolactin, pap, ultrasound with PCOS appearing ovaries (otherwise normal).  H/O HTN and migraines with aura.  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  Over the last 6 months her cycles have become more consistent. Coming monthly. 2 out of 6 months she bleed for 10 days. Cramps are severe, helped with ibuprofen. She is bleeding through a super tampon in 1.5-2 hours.   Sexually active, same partner for a couple of months. Using w/d for contraception. Not trying to get pregnant. Okay if it happens. No dyspareunia.  Normal bowel and bladder function.   She c/o an intermittent vaginal odor.   Stressful year, grandparents died, 68 year old brother died of an overdose (4 kids). Dad just had a MI. Her best friend has stage 4 lung cancer (74 years old).   Patient's last menstrual period was 01/31/2021.          Sexually active: Yes.    The current method of family planning is none.    Exercising yes     Walking  Smoker:  marijuana   Health Maintenance: Pap:  01/11/19 WNL HPV Neg,   12/10/16 WNL  History of abnormal Pap:  no MMG:  11/28/13 rt breast u/s Bi-rads 1 neg  BMD:   na  Colonoscopy: na  TDaP:  12/10/16  Gardasil: complete    reports that she has quit smoking. Her smoking use included e-cigarettes. She has never used smokeless tobacco. She reports current alcohol use of about 2.0 - 3.0 standard drinks per week. She reports current drug use. Drug: Marijuana. 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   Past Medical History:  Diagnosis Date   Asthma    well controlled; never on scheduled inhalers   HSV2    Hypertension    Migraines with aura     Past Surgical History:  Procedure Laterality Date   TONSILLECTOMY      Current Outpatient Medications  Medication Sig Dispense Refill   citalopram (CELEXA) 20 MG tablet TAKE 1 TABLET BY MOUTH ONCE A DAY,   avoid Ibuprofen with this medication, may cause GI bleeding 90 tablet 4   IBUPROFEN PO Take by mouth.     loratadine (CLARITIN) 10 MG tablet Take 10 mg by mouth daily.     LORazepam (ATIVAN) 0.5 MG tablet Take 1 tablet (0.5 mg total) by mouth at bedtime. 30 tablet 0   metoprolol succinate (TOPROL-XL) 25 MG 24 hr tablet Take 1 tablet (25 mg total) by mouth daily. 90 tablet 0   Multiple Vitamins-Minerals (MULTIVITAMIN PO) Take by mouth daily.     Topiramate ER (TROKENDI XR) 50 MG CP24 Take 1 capsule by mouth daily. 90 capsule 0   valACYclovir (VALTREX) 500 MG tablet One daily for suppression, increase to two daily at outbreak for 3 days (Patient taking differently: Take 500 mg by mouth daily. One daily for suppression, increase to two daily at outbreak for 3 days) 90 tablet 4   No current facility-administered medications for this visit.    Family History  Problem Relation Age of Onset  Anemia Mother    Hypertension Father    Heart attack Father    Anemia Sister    Anemia Maternal Grandmother    Diabetes Maternal Grandfather    Hypertension Maternal Grandfather    Cancer Other     Review of Systems  All other systems reviewed and are negative.  Exam:   BP 130/72   Pulse 100   Ht 5\' 4"  (1.626 m)   Wt 239 lb (108.4 kg)   LMP 01/31/2021   SpO2 99%   BMI 41.02 kg/m   Weight change: @WEIGHTCHANGE @ Height:   Height: 5\' 4"  (162.6 cm)  Ht Readings from Last 3 Encounters:  02/19/21 5\' 4"  (1.626 m)  01/09/21 5\' 4"  (1.626 m)  04/08/20 5\' 4"  (1.626 m)    General appearance: alert, cooperative and appears  stated age Head: Normocephalic, without obvious abnormality, atraumatic Neck: no adenopathy, supple, symmetrical, trachea midline and thyroid normal to inspection and palpation Lungs: clear to auscultation bilaterally Cardiovascular: regular rate and rhythm Breasts: normal appearance, no masses or tenderness Abdomen: soft, non-tender; non distended,  no masses,  no organomegaly Extremities: extremities normal, atraumatic, no cyanosis or edema Skin: Skin color, texture, turgor normal. No rashes or lesions Lymph nodes: Cervical, supraclavicular, and axillary nodes normal. No abnormal inguinal nodes palpated Neurologic: Grossly normal   Pelvic: External genitalia:  no lesions              Urethra:  normal appearing urethra with no masses, tenderness or lesions              Bartholins and Skenes: normal                 Vagina: normal appearing vagina with normal color and discharge, no lesions              Cervix: no lesions               Bimanual Exam:  Uterus:   no masses or tenderness              Adnexa: no mass, fullness, tenderness               Rectovaginal: Confirms               Anus:  normal sphincter tone, no lesions  02/21/21 chaperoned for the exam.  1. Well woman exam Discussed breast self exam Discussed calcium and vit D intake  Labs with primary  2. Screening examination for STD (sexually transmitted disease) - HIV Antibody (routine testing w rflx) - RPR - Hepatitis C antibody - SURESWAB CT/NG/T. vaginalis  3. Vaginal odor - WET PREP FOR TRICH, YEAST, CLUE  4. BMI 40.0-44.9, adult [facility]) Discussed option of healthy weight and wellness.  # given  5. History of herpes simplex type 2 infection On suppression - valACYclovir (VALTREX) 500 MG tablet; One daily for suppression, increase to two daily at outbreak for 3 days  Dispense: 90 tablet; Refill: 4  6. Grief Names of counselors given  Addendum: wet prep with yeast. She desires treatment. Diflucan 150 mg  x 1 sent to her pharmacy

## 2021-02-19 ENCOUNTER — Encounter: Payer: Self-pay | Admitting: Obstetrics and Gynecology

## 2021-02-19 ENCOUNTER — Ambulatory Visit (INDEPENDENT_AMBULATORY_CARE_PROVIDER_SITE_OTHER): Payer: No Typology Code available for payment source | Admitting: Obstetrics and Gynecology

## 2021-02-19 VITALS — BP 130/72 | HR 100 | Ht 64.0 in | Wt 239.0 lb

## 2021-02-19 DIAGNOSIS — Z8619 Personal history of other infectious and parasitic diseases: Secondary | ICD-10-CM

## 2021-02-19 DIAGNOSIS — Z113 Encounter for screening for infections with a predominantly sexual mode of transmission: Secondary | ICD-10-CM | POA: Diagnosis not present

## 2021-02-19 DIAGNOSIS — Z6841 Body Mass Index (BMI) 40.0 and over, adult: Secondary | ICD-10-CM | POA: Diagnosis not present

## 2021-02-19 DIAGNOSIS — B373 Candidiasis of vulva and vagina: Secondary | ICD-10-CM

## 2021-02-19 DIAGNOSIS — B3731 Acute candidiasis of vulva and vagina: Secondary | ICD-10-CM

## 2021-02-19 DIAGNOSIS — N898 Other specified noninflammatory disorders of vagina: Secondary | ICD-10-CM

## 2021-02-19 DIAGNOSIS — F4321 Adjustment disorder with depressed mood: Secondary | ICD-10-CM

## 2021-02-19 DIAGNOSIS — Z01419 Encounter for gynecological examination (general) (routine) without abnormal findings: Secondary | ICD-10-CM | POA: Diagnosis not present

## 2021-02-19 LAB — WET PREP FOR TRICH, YEAST, CLUE

## 2021-02-19 MED ORDER — VALACYCLOVIR HCL 500 MG PO TABS: ORAL_TABLET | ORAL | 4 refills | Status: DC

## 2021-02-19 MED ORDER — FLUCONAZOLE 150 MG PO TABS: 150.0000 mg | ORAL_TABLET | Freq: Once | ORAL | 0 refills | Status: AC

## 2021-02-19 NOTE — Patient Instructions (Addendum)
Counselor: Roosevelt Locks [phone]  Managing Loss, Adult People experience loss in many different ways throughout their lives. Events such as moving, changing jobs, and losing friends can create a sense of loss. The loss may be as serious as a major health change, divorce, death of a pet, or death of a loved one. All of these types of loss are likely to create a physical and emotional reaction known as grief. Grief is the result of a major change or an absence of something or someone that you count on. Grief is anormal reaction to loss. A variety of factors can affect your grieving experience, including: The nature of your loss. Your relationship to what or whom you lost. Your understanding of grief and how to manage it. Your support system. How to manage lifestyle changes Keep to your normal routine as much as possible. If you have trouble focusing or doing normal activities, it is acceptable to take some time away from your normal routine. Spend time with friends and loved ones. Eat a healthy diet, get plenty of sleep, and rest when you feel tired. How to recognize changes  The way that you deal with your grief will affect your ability to function as you normally do. When grieving, you may experience these changes: Numbness, shock, sadness, anxiety, anger, denial, and guilt. Thoughts about death. Unexpected crying. A physical sensation of emptiness in your stomach. Problems sleeping and eating. Tiredness (fatigue). Loss of interest in normal activities. Dreaming about or imagining seeing the person who died. A need to remember what or whom you lost. Difficulty thinking about anything other than your loss for a period of time. Relief. If you have been expecting the loss for a while, you may feel a sense of relief when it happens. Follow these instructions at home: Activity Express your feelings in healthy ways, such as: Talking with others about your loss. It may be helpful to  find others who have had a similar loss, such as a support group. Writing down your feelings in a journal. Doing physical activities to release stress and emotional energy. Doing creative activities like painting, sculpting, or playing or listening to music. Practicing resilience. This is the ability to recover and adjust after facing challenges. Reading some resources that encourage resilience may help you to learn ways to practice those behaviors.  General instructions Be patient with yourself and others. Allow the grieving process to happen, and remember that grieving takes time. It is likely that you may never feel completely done with some grief. You may find a way to move on while still cherishing memories and feelings about your loss. Accepting your loss is a process. It can take months or longer to adjust. Keep all follow-up visits as told by your health care provider. This is important. Where to find support To get support for managing loss: Ask your health care provider for help and recommendations, such as grief counseling or therapy. Think about joining a support group for people who are managing a loss. Where to find more information You can find more information about managing loss from: American Society of Clinical Oncology: www.cancer.net American Psychological Association: DiceTournament.ca Contact a health care provider if: Your grief is extreme and keeps getting worse. You have ongoing grief that does not improve. Your body shows symptoms of grief, such as illness. You feel depressed, anxious, or lonely. Get help right away if: You have thoughts about hurting yourself or others. If you ever feel like you may hurt yourself  or others, or have thoughts about taking your own life, get help right away. You can go to your nearest emergency department or call: Your local emergency services (911 in the [place] A suicide crisis helpline, such as the National Suicide Prevention Lifeline  at 502-515-8128. This is open 24 hours a day. Summary Grief is the result of a major change or an absence of someone or something that you count on. Grief is a normal reaction to loss. The depth of grief and the period of recovery depend on the type of loss and your ability to adjust to the change and process your feelings. Processing grief requires patience and a willingness to accept your feelings and talk about your loss with people who are supportive. It is important to find resources that work for you and to realize that people experience grief differently. There is not one grieving process that works for everyone in the same way. Be aware that when grief becomes extreme, it can lead to more severe issues like isolation, depression, anxiety, or suicidal thoughts. Talk with your health care provider if you have any of these issues. This information is not intended to replace advice given to you by your health care provider. Make sure you discuss any questions you have with your healthcare provider. Document Revised: 12/07/2019 Document Reviewed: 12/07/2019 Elsevier Patient Education  2022 Elsevier Inc.   EXERCISE   We recommended that you start or continue a regular exercise program for good health. Physical activity is anything that gets your body moving, some is better than none. The CDC recommends 150 minutes per week of Moderate-Intensity Aerobic Activity and 2 or more days of Muscle Strengthening Activity.  Benefits of exercise are limitless: helps weight loss/weight maintenance, improves mood and energy, helps with depression and anxiety, improves sleep, tones and strengthens muscles, improves balance, improves bone density, protects from chronic conditions such as heart disease, high blood pressure and diabetes and so much more. To learn more visit: http://kirby-bean.org/  DIET: Good nutrition starts with a healthy diet of fruits, vegetables, whole grains, and  lean protein sources. Drink plenty of water for hydration. Minimize empty calories, sodium, sweets. For more information about dietary recommendations visit: CriticalGas.be and https://www.carpenter-henry.info/  ALCOHOL:  Women should limit their alcohol intake to no more than 7 drinks/beers/glasses of wine (combined, not each!) per week. Moderation of alcohol intake to this level decreases your risk of breast cancer and liver damage.  If you are concerned that you may have a problem, or your friends have told you they are concerned about your drinking, there are many resources to help. A well-known program that is free, effective, and available to all people all over the nation is Alcoholics Anonymous.  Check out this site to learn more: BeverageBargains.co.za   CALCIUM AND VITAMIN D:  Adequate intake of calcium and Vitamin D are recommended for bone health.  You should be getting between 1000-1200 mg of calcium and 800 units of Vitamin D daily between diet and supplements  PAP SMEARS:  Pap smears, to check for cervical cancer or precancers,  have traditionally been done yearly, scientific advances have shown that most women can have pap smears less often.  However, every woman still should have a physical exam from her gynecologist every year. It will include a breast check, inspection of the vulva and vagina to check for abnormal growths or skin changes, a visual exam of the cervix, and then an exam to evaluate the size and shape of the  uterus and ovaries. We will also provide age appropriate advice regarding health maintenance, like when you should have certain vaccines, screening for sexually transmitted diseases, bone density testing, colonoscopy, mammograms, etc.   MAMMOGRAMS:  All women over 852 years old should have a routine mammogram.   COLON CANCER SCREENING: Now recommend starting at age 33. At this time colonoscopy is not covered for routine  screening until 50. There are take home tests that can be done between 45-49.   COLONOSCOPY:  Colonoscopy to screen for colon cancer is recommended for all women at age 33.  We know, you hate the idea of the prep.  We agree, BUT, having colon cancer and not knowing it is worse!!  Colon cancer so often starts as a polyp that can be seen and removed at colonscopy, which can quite literally save your life!  And if your first colonoscopy is normal and you have no family history of colon cancer, most women don't have to have it again for 10 years.  Once every ten years, you can do something that may end up saving your life, right?  We will be happy to help you get it scheduled when you are ready.  Be sure to check your insurance coverage so you understand how much it will cost.  It may be covered as a preventative service at no cost, but you should check your particular policy.      Breast Self-Awareness Breast self-awareness means being familiar with how your breasts look and feel. It involves checking your breasts regularly and reporting any changes to your health care provider. Practicing breast self-awareness is important. A change in your breasts can be a sign of a serious medical problem. Being familiar with how your breasts look and feel allows you to find any problems early, when treatment is more likely to be successful. All women should practice breast self-awareness, including women who have had breast implants. How to do a breast self-exam One way to learn what is normal for your breasts and whether your breasts are changing is to do a breast self-exam. To do a breast self-exam: Look for Changes  Remove all the clothing above your waist. Stand in front of a mirror in a room with good lighting. Put your hands on your hips. Push your hands firmly downward. Compare your breasts in the mirror. Look for differences between them (asymmetry), such as: Differences in shape. Differences in  size. Puckers, dips, and bumps in one breast and not the other. Look at each breast for changes in your skin, such as: Redness. Scaly areas. Look for changes in your nipples, such as: Discharge. Bleeding. Dimpling. Redness. A change in position. Feel for Changes Carefully feel your breasts for lumps and changes. It is best to do this while lying on your back on the floor and again while sitting or standing in the shower or tub with soapy water on your skin. Feel each breast in the following way: Place the arm on the side of the breast you are examining above your head. Feel your breast with the other hand. Start in the nipple area and make  inch (2 cm) overlapping circles to feel your breast. Use the pads of your three middle fingers to do this. Apply light pressure, then medium pressure, then firm pressure. The light pressure will allow you to feel the tissue closest to the skin. The medium pressure will allow you to feel the tissue that is a little deeper. The firm pressure  will allow you to feel the tissue close to the ribs. Continue the overlapping circles, moving downward over the breast until you feel your ribs below your breast. Move one finger-width toward the center of the body. Continue to use the  inch (2 cm) overlapping circles to feel your breast as you move slowly up toward your collarbone. Continue the up and down exam using all three pressures until you reach your armpit.  Write Down What You Find  Write down what is normal for each breast and any changes that you find. Keep a written record with breast changes or normal findings for each breast. By writing this information down, you do not need to depend only on memory for size, tenderness, or location. Write down where you are in your menstrual cycle, if you are still menstruating. If you are having trouble noticing differences in your breasts, do not get discouraged. With time you will become more familiar with the  variations in your breasts and more comfortable with the exam. How often should I examine my breasts? Examine your breasts every month. If you are breastfeeding, the best time to examine your breasts is after a feeding or after using a breast pump. If you menstruate, the best time to examine your breasts is 5-7 days after your period is over. During your period, your breasts are lumpier, and it may be more difficult to notice changes. When should I see my health care provider? See your health care provider if you notice: A change in shape or size of your breasts or nipples. A change in the skin of your breast or nipples, such as a reddened or scaly area. Unusual discharge from your nipples. A lump or thick area that was not there before. Pain in your breasts. Anything that concerns you.

## 2021-02-19 NOTE — Addendum Note (Signed)
Addended by: Tobi Bastos on: 02/19/2021 03:57 PM   Modules accepted: Orders

## 2021-02-20 LAB — SURESWAB CT/NG/T. VAGINALIS
C. trachomatis RNA, TMA: NOT DETECTED
N. gonorrhoeae RNA, TMA: NOT DETECTED
Trichomonas vaginalis RNA: NOT DETECTED

## 2021-02-20 LAB — RPR: RPR Ser Ql: NONREACTIVE

## 2021-02-20 LAB — HIV ANTIBODY (ROUTINE TESTING W REFLEX): HIV 1&2 Ab, 4th Generation: NONREACTIVE

## 2021-02-20 LAB — HEPATITIS C ANTIBODY
Hepatitis C Ab: NONREACTIVE
SIGNAL TO CUT-OFF: 0 (ref <1.00)

## 2021-02-21 ENCOUNTER — Ambulatory Visit: Payer: No Typology Code available for payment source | Admitting: Physician Assistant

## 2021-02-21 ENCOUNTER — Other Ambulatory Visit: Payer: Self-pay

## 2021-02-21 ENCOUNTER — Encounter: Payer: Self-pay | Admitting: Physician Assistant

## 2021-02-21 VITALS — BP 130/100 | HR 74 | Temp 97.9°F | Ht 64.0 in | Wt 241.2 lb

## 2021-02-21 DIAGNOSIS — I10 Essential (primary) hypertension: Secondary | ICD-10-CM

## 2021-02-21 DIAGNOSIS — H9201 Otalgia, right ear: Secondary | ICD-10-CM

## 2021-02-21 DIAGNOSIS — G43109 Migraine with aura, not intractable, without status migrainosus: Secondary | ICD-10-CM | POA: Diagnosis not present

## 2021-02-21 MED ORDER — TROKENDI XR 100 MG PO CP24: 100.0000 mg | ORAL_CAPSULE | Freq: Every day | ORAL | 1 refills | Status: DC

## 2021-02-21 MED ORDER — TRAMADOL HCL 50 MG PO TABS: 50.0000 mg | ORAL_TABLET | Freq: Three times a day (TID) | ORAL | 0 refills | Status: AC | PRN

## 2021-02-21 MED ORDER — LOSARTAN POTASSIUM-HCTZ 50-12.5 MG PO TABS: 1.0000 | ORAL_TABLET | Freq: Every day | ORAL | 0 refills | Status: DC

## 2021-02-21 NOTE — Patient Instructions (Addendum)
It was great to see you!  Stop metoprolol 25 mg xr Start losartan-hydrochlorothiazide 50-12.5 mg Check blood pressure 2-3 times a week Send me mychart message in about a month or so to let me know what your readings are doing  Increase trokendi to 100 mg Referral to Dr. Lucia Gaskins at [facility] ordered today  Tramadol may be used for severe pain  Please take over the counter nexium, pepcid or prilosec daily while taking ibuprofen  Please follow up with your dentist, see if they can help out with your sleep apnea with an oral device as this can be contributing to your headaches and blood pressure  If a referral was placed today, you will be contacted for an appointment. Please note that routine referrals can sometimes take up to 3-4 weeks to process. Please call our office if you haven't heard anything after this time frame.  Take care,  Jarold Motto PA-C

## 2021-02-21 NOTE — Progress Notes (Signed)
Brandi Navarro is a 35 y.o. female here for a follow up of a pre-existing problem.  I acted as a Neurosurgeon for [employer], PA-C [employer], LPN   History of Present Illness:   Chief Complaint  Patient presents with   Otalgia   Hypertension    HPI  Otalgia Pt c/o right earache off and on since June, worse past week going down into jaw. Denies recent URI, nausea, vomiting.  She does report that she has a tooth on the upper part of her jaw on the right side that has been troubling her.  Hypertension Pt c/o blood pressure still elevated, she is checking blood pressure at home systolic 130's-140's, diastolic 90's. Had checked at chiropractor a week ago and was elevated then too. Pt has been having headaches. And some lower leg swelling. Pt has been having headaches everyday, using Trokendi ER and Ibuprofen.Pt denies dizziness, blurred vision, chest pain, SOB. Denies excessive caffeine intake, stimulant usage, excessive alcohol intake or increase in salt consumption.  BP Readings from Last 3 Encounters:  02/21/21 (!) 130/100  02/19/21 130/72  01/09/21 118/82   Recurrent headaches Patient reports that she has been having ongoing headaches that are not well controlled.  She is on Trokendi 50 mg daily.  She is taking several ibuprofen a day up to 7 or 8 a day.  She is interested in seeing a neurologist.  Denies: Changes in vision, slurred speech, weakness on one side of the body    Past Medical History:  Diagnosis Date   Asthma    well controlled; never on scheduled inhalers   HSV2    Hypertension    Migraines with aura      Social History   Tobacco Use   Smoking status: Former    Types: E-cigarettes   Smokeless tobacco: Never  Vaping Use   Vaping Use: Never used  Substance Use Topics   Alcohol use: Yes    Alcohol/week: 2.0 - 3.0 standard drinks    Types: 2 - 3 Standard drinks or equivalent per week   Drug use: Yes    Types: Marijuana    Comment: daily    Past  Surgical History:  Procedure Laterality Date   TONSILLECTOMY      Family History  Problem Relation Age of Onset   Anemia Mother    Hypertension Father    Heart attack Father    Anemia Sister    Anemia Maternal Grandmother    Diabetes Maternal Grandfather    Hypertension Maternal Grandfather    Cancer Other     No Known Allergies  Current Medications:   Current Outpatient Medications:    citalopram (CELEXA) 20 MG tablet, TAKE 1 TABLET BY MOUTH ONCE A DAY,   avoid Ibuprofen with this medication, may cause GI bleeding, Disp: 90 tablet, Rfl: 4   IBUPROFEN PO, Take by mouth., Disp: , Rfl:    loratadine (CLARITIN) 10 MG tablet, Take 10 mg by mouth daily., Disp: , Rfl:    LORazepam (ATIVAN) 0.5 MG tablet, Take 1 tablet (0.5 mg total) by mouth at bedtime., Disp: 30 tablet, Rfl: 0   losartan-hydrochlorothiazide (HYZAAR) 50-12.5 MG tablet, Take 1 tablet by mouth daily., Disp: 90 tablet, Rfl: 0   Multiple Vitamins-Minerals (MULTIVITAMIN PO), Take by mouth daily., Disp: , Rfl:    Topiramate ER (TROKENDI XR) 100 MG CP24, Take 100 mg by mouth daily at 12 noon., Disp: 90 capsule, Rfl: 1   traMADol (ULTRAM) 50 MG tablet, Take 1  tablet (50 mg total) by mouth every 8 (eight) hours as needed for up to 5 days., Disp: 15 tablet, Rfl: 0   valACYclovir (VALTREX) 500 MG tablet, One daily for suppression, increase to two daily at outbreak for 3 days, Disp: 90 tablet, Rfl: 4   Review of Systems:   ROS Negative unless otherwise specified per HPI.  Vitals:   Vitals:   02/21/21 1501  BP: (!) 130/100  Pulse: 74  Temp: 97.9 F (36.6 C)  TempSrc: Temporal  SpO2: 97%  Weight: 241 lb 4 oz (109.4 kg)  Height: 5\' 4"  (1.626 m)     Body mass index is 41.41 kg/m.  Physical Exam:   Physical Exam Vitals and nursing note reviewed.  Constitutional:      General: She is not in acute distress.    Appearance: She is well-developed. She is not ill-appearing or toxic-appearing.  HENT:     Head:  Normocephalic and atraumatic.     Right Ear: Tympanic membrane, ear canal and external ear normal. Tympanic membrane is not erythematous, retracted or bulging.     Left Ear: Tympanic membrane, ear canal and external ear normal. Tympanic membrane is not erythematous, retracted or bulging.     Nose: Nose normal.     Right Sinus: No maxillary sinus tenderness or frontal sinus tenderness.     Left Sinus: No maxillary sinus tenderness or frontal sinus tenderness.     Mouth/Throat:     Pharynx: Uvula midline. No oropharyngeal exudate or posterior oropharyngeal erythema.  Cardiovascular:     Rate and Rhythm: Normal rate and regular rhythm.     Pulses: Normal pulses.     Heart sounds: Normal heart sounds, S1 normal and S2 normal.     Comments: No LE edema Pulmonary:     Effort: Pulmonary effort is normal. No accessory muscle usage or respiratory distress.     Breath sounds: Normal breath sounds.  Lymphadenopathy:     Cervical: No cervical adenopathy.  Skin:    General: Skin is warm and dry.  Neurological:     Mental Status: She is alert.     GCS: GCS eye subscore is 4. GCS verbal subscore is 5. GCS motor subscore is 6.  Psychiatric:        Speech: Speech normal.        Behavior: Behavior normal. Behavior is cooperative.      Assessment and Plan:   Brandi Navarro was seen today for otalgia and hypertension.  Diagnoses and all orders for this visit:  Essential hypertension Uncontrolled I think this is likely uncontrolled due to excessive ibuprofen use, stress and uncontrolled sleep apnea Stop metoprolol 25 mg xr Start losartan-hydrochlorothiazide 50-12.5 mg Check blood pressure 2-3 times a week Send me mychart message in about a month or so to let me know what your readings are doing -     CBC with Differential/Platelet; Future -     Comprehensive metabolic panel; Future -     CBC with Differential/Platelet -     Comprehensive metabolic panel  Migraine with aura and without status  migrainosus, not intractable Uncontrolled No red flag symptoms Referral to neurology Increase Trokendi to 100 mg daily Try to avoid excessive ibuprofen, she also may be dealing with rebound headaches Also consider rare use of tramadol to help with symptoms, and also for ear/tooth pain, precautions advised -     Ambulatory referral to Neurology  Right ear pain No red flags on the exam Continue allergy pill Recommend  she follow-up with her dentist as she may be having some referred pain from her tooth  Other orders -     losartan-hydrochlorothiazide (HYZAAR) 50-12.5 MG tablet; Take 1 tablet by mouth daily. -     traMADol (ULTRAM) 50 MG tablet; Take 1 tablet (50 mg total) by mouth every 8 (eight) hours as needed for up to 5 days. -     Topiramate ER (TROKENDI XR) 100 MG CP24; Take 100 mg by mouth daily at 12 noon.  CMA or LPN served as scribe during this visit. History, Physical, and Plan performed by medical provider. The above documentation has been reviewed and is accurate and complete.   Jarold Motto, PA-C

## 2021-02-22 LAB — COMPREHENSIVE METABOLIC PANEL
AG Ratio: 1.6 (calc) (ref 1.0–2.5)
ALT: 48 U/L — ABNORMAL HIGH (ref 6–29)
AST: 28 U/L (ref 10–30)
Albumin: 4.2 g/dL (ref 3.6–5.1)
Alkaline phosphatase (APISO): 101 U/L (ref 31–125)
BUN: 17 mg/dL (ref 7–25)
CO2: 23 mmol/L (ref 20–32)
Calcium: 9.4 mg/dL (ref 8.6–10.2)
Chloride: 107 mmol/L (ref 98–110)
Creat: 0.81 mg/dL (ref 0.50–0.97)
Globulin: 2.6 g/dL (calc) (ref 1.9–3.7)
Glucose, Bld: 76 mg/dL (ref 65–99)
Potassium: 4 mmol/L (ref 3.5–5.3)
Sodium: 140 mmol/L (ref 135–146)
Total Bilirubin: 0.3 mg/dL (ref 0.2–1.2)
Total Protein: 6.8 g/dL (ref 6.1–8.1)

## 2021-02-22 LAB — CBC WITH DIFFERENTIAL/PLATELET
Absolute Monocytes: 967 cells/uL — ABNORMAL HIGH (ref 200–950)
Basophils Absolute: 37 cells/uL (ref 0–200)
Basophils Relative: 0.4 %
Eosinophils Absolute: 37 cells/uL (ref 15–500)
Eosinophils Relative: 0.4 %
HCT: 40.7 % (ref 35.0–45.0)
Hemoglobin: 13.4 g/dL (ref 11.7–15.5)
Lymphs Abs: 2232 cells/uL (ref 850–3900)
MCH: 31.5 pg (ref 27.0–33.0)
MCHC: 32.9 g/dL (ref 32.0–36.0)
MCV: 95.8 fL (ref 80.0–100.0)
MPV: 11.4 fL (ref 7.5–12.5)
Monocytes Relative: 10.4 %
Neutro Abs: 6026 cells/uL (ref 1500–7800)
Neutrophils Relative %: 64.8 %
Platelets: 329 10*3/uL (ref 140–400)
RBC: 4.25 10*6/uL (ref 3.80–5.10)
RDW: 12.7 % (ref 11.0–15.0)
Total Lymphocyte: 24 %
WBC: 9.3 10*3/uL (ref 3.8–10.8)

## 2021-02-24 ENCOUNTER — Other Ambulatory Visit: Payer: Self-pay | Admitting: Physician Assistant

## 2021-02-24 DIAGNOSIS — R748 Abnormal levels of other serum enzymes: Secondary | ICD-10-CM

## 2021-03-18 ENCOUNTER — Encounter: Payer: Self-pay | Admitting: Obstetrics and Gynecology

## 2021-03-18 DIAGNOSIS — F4323 Adjustment disorder with mixed anxiety and depressed mood: Secondary | ICD-10-CM

## 2021-03-18 MED ORDER — CITALOPRAM HYDROBROMIDE 20 MG PO TABS: ORAL_TABLET | ORAL | 4 refills | Status: DC

## 2021-06-01 ENCOUNTER — Other Ambulatory Visit: Payer: Self-pay | Admitting: Physician Assistant

## 2021-06-16 ENCOUNTER — Other Ambulatory Visit: Payer: Self-pay | Admitting: Physician Assistant

## 2021-09-11 ENCOUNTER — Other Ambulatory Visit: Payer: Self-pay | Admitting: Physician Assistant

## 2021-09-25 NOTE — Progress Notes (Incomplete)
? ?SCRIBE STATEMENT ? ?Subjective:  ?  ?Brandi Navarro is a 36 y.o. female and is here for a comprehensive physical exam. ? ?HPI ? ?There are no preventive care reminders to display for this patient. ? ?Acute Concerns: ?Obesity ? ? ?Chronic Issues: ?HTN ?Currently compliant with taking losartan-HCTZ 50-12.5 mg daily with no adverse effects. At home blood pressure readings are: ***. Patient denies chest pain, SOB, blurred vision, dizziness, unusual headaches, lower leg swelling. Patient is *** compliant with medication. Denies excessive caffeine intake, stimulant usage, excessive alcohol intake, or increase in salt consumption. ? ?BP Readings from Last 3 Encounters:  ?02/21/21 (!) 130/100  ?02/19/21 130/72  ?01/09/21 118/82  ? ?Migraines ? ? ?Health Maintenance: ?Immunizations -- Covid- ?Influenza-  ?Tdap-  ?PAP -- *** ?Bone Density -- *** ?Diet -- *** ?Sleep habits -- *** ?Exercise -- *** ?Current Weight -- @FLOWAMB (14)@  ?Weight History: ?Wt Readings from Last 10 Encounters:  ?02/21/21 241 lb 4 oz (109.4 kg)  ?02/19/21 239 lb (108.4 kg)  ?01/09/21 236 lb 3.2 oz (107.1 kg)  ?04/08/20 243 lb (110.2 kg)  ?03/06/20 250 lb (113.4 kg)  ?02/20/20 253 lb (114.8 kg)  ?02/14/20 253 lb (114.8 kg)  ?12/05/19 267 lb 6.4 oz (121.3 kg)  ?11/03/19 272 lb 3.2 oz (123.5 kg)  ?06/05/19 258 lb (117 kg)  ? ?There is no height or weight on file to calculate BMI. ?Mood -- *** ? ?No LMP recorded. ?Period characteristics -- *** ?Birth control -- *** ? ? ? reports current alcohol use of about 2.0 - 3.0 standard drinks per week.  ?Tobacco Use: Medium Risk  ? Smoking Tobacco Use: Former  ? Smokeless Tobacco Use: Never  ? Passive Exposure: Not on file  ? ? ? ? ?  11/03/2019  ?  2:34 PM  ?Depression screen PHQ 2/9  ?Decreased Interest 0  ?Down, Depressed, Hopeless 0  ?PHQ - 2 Score 0  ? ? ? ?Other providers/specialists: ?Patient Care Team: ?Inda Coke, PA as PCP - General (Physician Assistant) ?Salvadore Dom, MD as Consulting  Physician (Obstetrics and Gynecology)  ? ?PMHx, SurgHx, SocialHx, Medications, and Allergies were reviewed in the Visit Navigator and updated as appropriate.  ? ?Past Medical History:  ?Diagnosis Date  ? Asthma   ? well controlled; never on scheduled inhalers  ? HSV2   ? Hypertension   ? Migraines with aura   ? ? ? ?Past Surgical History:  ?Procedure Laterality Date  ? TONSILLECTOMY    ? ? ? ?Family History  ?Problem Relation Age of Onset  ? Anemia Mother   ? Hypertension Father   ? Heart attack Father   ? Anemia Sister   ? Anemia Maternal Grandmother   ? Diabetes Maternal Grandfather   ? Hypertension Maternal Grandfather   ? Cancer Other   ? ? ?Social History  ? ?Tobacco Use  ? Smoking status: Former  ?  Types: E-cigarettes  ? Smokeless tobacco: Never  ?Vaping Use  ? Vaping Use: Never used  ?Substance Use Topics  ? Alcohol use: Yes  ?  Alcohol/week: 2.0 - 3.0 standard drinks  ?  Types: 2 - 3 Standard drinks or equivalent per week  ? Drug use: Yes  ?  Types: Marijuana  ?  Comment: daily  ? ? ?Review of Systems:  ? ?ROS ?Negative unless otherwise specified per HPI. ?Objective:  ? ?There were no vitals taken for this visit. ? ?General Appearance:    Alert, cooperative, no distress, appears stated age  ?  Head:    Normocephalic, without obvious abnormality, atraumatic  ?Eyes:    PERRL, conjunctiva/corneas clear, EOM's intact, fundi  ?  benign, both eyes  ?Ears:    Normal TM's and external ear canals, both ears  ?Nose:   Nares normal, septum midline, mucosa normal, no drainage    or sinus tenderness  ?Throat:   Lips, mucosa, and tongue normal; teeth and gums normal  ?Neck:   Supple, symmetrical, trachea midline, no adenopathy;  ?  thyroid:  no enlargement/tenderness/nodules; no carotid ?  bruit or JVD  ?Back:     Symmetric, no curvature, ROM normal, no CVA tenderness  ?Lungs:     Clear to auscultation bilaterally, respirations unlabored  ?Chest Wall:    No tenderness or deformity  ? Heart:    Regular rate and rhythm, S1  and S2 normal, no murmur, rub   or gallop  ?Breast Exam:    No tenderness, masses, or nipple abnormality  ?Abdomen:     Soft, non-tender, bowel sounds active all four quadrants,  ?  no masses, no organomegaly  ?Genitalia:    Normal female without lesion, discharge or tenderness  ?Rectal:    Normal tone no masses or tenderness  ?Extremities:   Extremities normal, atraumatic, no cyanosis or edema  ?Pulses:   2+ and symmetric all extremities  ?Skin:   Skin color, texture, turgor normal, no rashes or lesions  ?Lymph nodes:   Cervical, supraclavicular, and axillary nodes normal  ?Neurologic:   CNII-XII intact, normal strength, sensation and reflexes  ?  throughout  ? ? ?Assessment/Plan:  ? ?@DIAGLIST @ ? ? ? ?Patient Counseling: ? ? ?[x]    ? Nutrition: Stressed importance of moderation in sodium/caffeine intake, saturated fat and cholesterol, caloric balance, sufficient intake of fresh fruits, vegetables, fiber, calcium, iron, and 1 mg of folate supplement per day (for females capable of pregnancy).  ? ?[x]    ?  ?Stressed the importance of regular exercise.   ? ?[x]    ? Substance Abuse: Discussed cessation/primary prevention of tobacco, alcohol, or other drug use; driving or other dangerous activities under the influence; availability of treatment for abuse.   ? ?[x]      Injury prevention: Discussed safety belts, safety helmets, smoke detector, smoking near bedding or upholstery.   ? ?[x]    ? ? Sexuality: Discussed sexually transmitted diseases, partner selection, use of condoms, avoidance of unintended pregnancy  and contraceptive alternatives.   ? ?[x]    ? Dental health: Discussed importance of regular tooth brushing, flossing, and dental visits.  ? ?[x]     ? Health maintenance and immunizations reviewed. Please refer to Health maintenance section.  ? ?I,Havlyn C Ratchford,acting as a scribe for [employer], PA.,have documented all relevant documentation on the behalf of Inda Coke, PA,as directed by  Inda Coke, PA while in the presence of Inda Coke, [place]. ? ?*** ? ?Inda Coke, PA-C ?[place] ? ? ? ?

## 2021-09-26 ENCOUNTER — Encounter: Payer: Self-pay | Admitting: Physician Assistant

## 2021-09-26 ENCOUNTER — Ambulatory Visit: Payer: No Typology Code available for payment source | Admitting: Physician Assistant

## 2021-09-26 ENCOUNTER — Telehealth (INDEPENDENT_AMBULATORY_CARE_PROVIDER_SITE_OTHER): Payer: No Typology Code available for payment source | Admitting: Physician Assistant

## 2021-09-26 VITALS — Ht 64.0 in | Wt 250.0 lb

## 2021-09-26 DIAGNOSIS — I10 Essential (primary) hypertension: Secondary | ICD-10-CM

## 2021-09-26 MED ORDER — PHENTERMINE HCL 37.5 MG PO TABS: 37.5000 mg | ORAL_TABLET | Freq: Every day | ORAL | 2 refills | Status: DC

## 2021-09-26 NOTE — Progress Notes (Signed)
? ?Virtual Visit via Video Note  ? ?IJarold Motto, connected with  Zeena Starkel  ([id number], 1985/12/20) on 09/26/21 at  3:00 PM EDT by a video-enabled telemedicine application and verified that I am speaking with the correct person using two identifiers. ? ?Location: ?Patient: Home ?Provider: [facility] office ?  ?I discussed the limitations of evaluation and management by telemedicine and the availability of in person appointments. The patient expressed understanding and agreed to proceed.   ? ?History of Present Illness: ?Brandi Navarro is a 36 y.o. who identifies as a female who was assigned female at birth, and is being seen today for weight loss. ? ?Patient reports that she is having difficulty with weight loss. We had her on phentermine but stopped when she was having difficulty working on her mental health and needed to take a break from weight loss efforts. ? ?HTN ?Currently taking losartan-hctz 50-12.5 mg. At home blood pressure readings are: not regularly checked. Patient denies chest pain, SOB, blurred vision, dizziness, unusual headaches, lower leg swelling. Patient is compliant with medication. Denies excessive caffeine intake, stimulant usage, excessive alcohol intake, or increase in salt consumption. ? ?BP Readings from Last 3 Encounters:  ?02/21/21 (!) 130/100  ?02/19/21 130/72  ?01/09/21 118/82  ? ? ? ?Problems:  ?Patient Active Problem List  ? Diagnosis Date Noted  ? Recurrent tonsillitis 09/12/2018  ? Morbid obesity (HCC) 03/22/2018  ? 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 Migraine with aura and without status migrainosus, not intractable 12/19/2017  ? PMDD (premenstrual dysphoric disorder) 12/19/2017  ? Seasonal allergic rhinitis due to pollen 12/19/2017  ? Essential hypertension 12/19/2017  ? Marijuana user 12/19/2017  ? Pure hypercholesterolemia 12/19/2017  ?  ?Allergies: No Known Allergies ?Medications:  ?Current Outpatient Medications:  ?  citalopram  (CELEXA) 20 MG tablet, TAKE 1 TABLET BY MOUTH ONCE A DAY,   avoid Ibuprofen with this medication, may cause GI bleeding, Disp: 90 tablet, Rfl: 4 ?  IBUPROFEN PO, Take by mouth., Disp: , Rfl:  ?  loratadine (CLARITIN) 10 MG tablet, Take 10 mg by mouth daily., Disp: , Rfl:  ?  losartan-hydrochlorothiazide (HYZAAR) 50-12.5 MG tablet, Take 1 tablet by mouth once daily, Disp: 90 tablet, Rfl: 0 ?  Multiple Vitamins-Minerals (MULTIVITAMIN PO), Take by mouth daily., Disp: , Rfl:  ?  phentermine (ADIPEX-P) 37.5 MG tablet, Take 1 tablet (37.5 mg total) by mouth daily before breakfast., Disp: 30 tablet, Rfl: 2 ?  Topiramate ER (TROKENDI XR) 100 MG CP24, Take 100 mg by mouth daily at 12 noon., Disp: 90 capsule, Rfl: 1 ?  valACYclovir (VALTREX) 500 MG tablet, One daily for suppression, increase to two daily at outbreak for 3 days, Disp: 90 tablet, Rfl: 4 ?  LORazepam (ATIVAN) 0.5 MG tablet, Take 1 tablet (0.5 mg total) by mouth at bedtime. (Patient not taking: Reported on 09/26/2021), Disp: 30 tablet, Rfl: 0 ? ?Observations/Objective: ?Patient is well-developed, well-nourished in no acute distress.  ?Resting comfortably  at home.  ?Head is normocephalic, atraumatic.  ?No labored breathing.  ?Speech is clear and coherent with logical content.  ?Patient is alert and oriented at baseline.  ? ?Assessment and Plan: ?1. Essential hypertension ?Suspect well controlled ?Recommend close monitoring of BP while starting phentermine ?Continue losartan-hctz 50-12.5 mg  ?If BP consistently > 140/90, recommended to call office ?Follow-up in 3 months, sooner if concerns ? ? ?2. Morbid obesity (HCC) ?Uncontrolled ?Restart phentermine ?Aunt had thyroid cancer -- unknown type -- will avoid  GLP-1 ?Discussed risk of phentermine and she is willing to proceed ?Start 37.5 mg phentermine however recommend starting with 1/2 tablets for 1-2 weeks ?Monitor BP closely while on medication and d/c med if consistently >140/90 or symptoms such as chest pain,  SOB, palpitations ?Follow-up in 3 months, sooner if concerns ? ? ?Follow Up Instructions: ?I discussed the assessment and treatment plan with the patient. The patient was provided an opportunity to ask questions and all were answered. The patient agreed with the plan and demonstrated an understanding of the instructions.  A copy of instructions were sent to the patient via MyChart unless otherwise noted below.  ? ?The patient was advised to call back or seek an in-person evaluation if the symptoms worsen or if the condition fails to improve as anticipated. ? ?Jarold Motto, PA ?

## 2021-12-28 ENCOUNTER — Other Ambulatory Visit: Payer: Self-pay | Admitting: Physician Assistant

## 2022-01-20 ENCOUNTER — Encounter: Payer: Self-pay | Admitting: Physician Assistant

## 2022-01-20 DIAGNOSIS — M25579 Pain in unspecified ankle and joints of unspecified foot: Secondary | ICD-10-CM

## 2022-01-21 NOTE — Addendum Note (Signed)
Addended by: Jimmye Norman on: 01/21/2022 09:56 AM   Modules accepted: Orders

## 2022-01-22 ENCOUNTER — Encounter: Payer: Self-pay | Admitting: Family Medicine

## 2022-01-22 ENCOUNTER — Ambulatory Visit: Payer: No Typology Code available for payment source | Admitting: Family Medicine

## 2022-01-22 ENCOUNTER — Ambulatory Visit (INDEPENDENT_AMBULATORY_CARE_PROVIDER_SITE_OTHER): Payer: No Typology Code available for payment source

## 2022-01-22 ENCOUNTER — Ambulatory Visit: Payer: Self-pay

## 2022-01-22 VITALS — BP 120/86 | HR 78 | Ht 64.0 in | Wt 274.6 lb

## 2022-01-22 DIAGNOSIS — M25572 Pain in left ankle and joints of left foot: Secondary | ICD-10-CM | POA: Diagnosis not present

## 2022-01-22 NOTE — Progress Notes (Signed)
Subjective:    CC: L ankle pain  I, [name], LAT, ATC, am serving as scribe for Dr. Clementeen Graham.  HPI: Pt is a 36 y/o female presenting w/ c/o L ankle pain since 12/25/21 when she stepped off the edge of her driveway and rolled her ankle.  She locates her pain to her L lateral ankle. She notes some numbness and tingling into the dorsal foot. She works at a [employer] in [place].   Radiating pain: yes into her L lateral lower leg and foot Ankle swelling: yes Aggravating factors: L ankle inversion/eversion; walking/weight-bearing Treatments tried: boot; IBU; elevation; ice  Pertinent review of Systems: No fever or chills  Relevant historical information: HTN, Obesity   Objective:    Vitals:   01/22/22 0851  BP: 120/86  Pulse: 78  SpO2: 95%   General: Well Developed, well nourished, and in no acute distress.   MSK: Left knee: Normal appearing Normal motion. Tender palpation at fibular head. Stable ligamentous exam. Intact strength.  Left ankle: Moderate effusion.  Tender palpation at lateral ankle at lateral malleolus. Stable ligamentous exam pain with talar tilt test. Intact strength pain with resisted eversion. Pulses cap refill and sensation are intact distally.  Left foot: Nontender.  Pulses cap refill and sensation are intact distally.  Lab and Radiology Results  Diagnostic Limited MSK Ultrasound of: Left ankle Peroneal tendons mild hypoechoic fluid surrounds tendon within tendon sheath consistent with tenosynovitis.  No visible tendon tear is present. Joint effusion present at lateral ankle. No acute fracture is visible on ultrasound. Impression: Lateral ankle effusion and peroneal tenosynovitis present.   X-ray images left foot and left knee obtained today personally and independently interpreted  Left ankle: No acute fractures.  Mild joint effusion present.  Left knee: Mild DJD.  No acute fractures.  Await formal radiology  review    Impression and Recommendations:    Assessment and Plan: 36 y.o. female with left ankle pain after ankle inversion injury occurring about a month ago thought to be ankle sprain.  Plan to treat with physical therapy and compressive ankle sleeve for stabilization and compression.  Try to wean out of CAM Walker boot if possible.  Recheck in 6 weeks.  PDMP not reviewed this encounter. Orders Placed This Encounter  Procedures   DG Ankle Complete Left    Standing Status:   Future    Number of Occurrences:   1    Standing Expiration Date:   02/22/2022    Order Specific Question:   Reason for Exam (SYMPTOM  OR DIAGNOSIS REQUIRED)    Answer:   L ankle pain    Order Specific Question:   Is patient pregnant?    Answer:   No    Order Specific Question:   Preferred imaging location?    Answer:   [facility]   DG Knee AP/LAT W/[place] Left    Standing Status:   Future    Number of Occurrences:   1    Standing Expiration Date:   02/22/2022    Order Specific Question:   Reason for Exam (SYMPTOM  OR DIAGNOSIS REQUIRED)    Answer:   L fibular pain    Order Specific Question:   Is patient pregnant?    Answer:   No    Order Specific Question:   Preferred imaging location?    Answer:   [facility]   [name] LIMITED JOINT SPACE STRUCTURES LOW LEFT(NO LINKED CHARGES)  Order Specific Question:   Reason for Exam (SYMPTOM  OR DIAGNOSIS REQUIRED)    Answer:   lt ankle    Order Specific Question:   Preferred imaging location?    Answer:   [facility] referral to Physical Therapy    Referral Priority:   Routine    Referral Type:   Physical Medicine    Referral Reason:   Specialty Services Required    Requested Specialty:   Physical Therapy    Number of Visits Requested:   1   No orders of the defined types were placed in this encounter.   Discussed warning signs or symptoms. Please see discharge instructions. Patient expresses  understanding.   The above documentation has been reviewed and is accurate and complete Clementeen Graham, M.D.

## 2022-01-22 NOTE — Patient Instructions (Addendum)
Nice to see you today.  PT to [facility].  Their office will call you to schedule but please let us know if you don't hear from them in one week regarding scheduling.  I recommend you obtained a compression sleeve to help with your joint problems. There are many options on the market however I recommend obtaining a full ankle Body Helix compression sleeve.  You can find information (including how to appropriate measure yourself for sizing) can be found at www.[email or web address].ch.  Many of these products are health savings account (HSA) eligible.   You can use the compression sleeve at any time throughout the day but is most important to use while being active as well as for 2 hours post-activity.   It is appropriate to ice following activity with the compression sleeve in place.   Follow-up: 6 weeks

## 2022-01-23 NOTE — Progress Notes (Signed)
Left ankle x-ray shows no fractures.

## 2022-01-23 NOTE — Progress Notes (Signed)
Left knee x-ray shows no fracture.  No significant arthritis is seen.

## 2022-01-28 ENCOUNTER — Ambulatory Visit: Payer: No Typology Code available for payment source | Attending: Family Medicine | Admitting: Physical Therapy

## 2022-01-28 DIAGNOSIS — R600 Localized edema: Secondary | ICD-10-CM | POA: Insufficient documentation

## 2022-01-28 DIAGNOSIS — M25672 Stiffness of left ankle, not elsewhere classified: Secondary | ICD-10-CM | POA: Insufficient documentation

## 2022-01-28 DIAGNOSIS — M25572 Pain in left ankle and joints of left foot: Secondary | ICD-10-CM | POA: Insufficient documentation

## 2022-01-28 DIAGNOSIS — R262 Difficulty in walking, not elsewhere classified: Secondary | ICD-10-CM | POA: Insufficient documentation

## 2022-01-28 DIAGNOSIS — R2689 Other abnormalities of gait and mobility: Secondary | ICD-10-CM | POA: Insufficient documentation

## 2022-01-28 NOTE — Therapy (Signed)
OUTPATIENT PHYSICAL THERAPY LOWER EXTREMITY EVALUATION   Patient Name: Brandi Navarro MRN: [id number] DOB:09/08/1985, 36 y.o., female Today's Date: 01/28/2022   PT End of Session - 01/28/22 1319     Visit Number 1    Number of Visits 6    Date for PT Re-Evaluation 03/11/22    Authorization Type [employer]    PT Start Time 1315    PT Stop Time 1400    PT Time Calculation (min) 45 min    Activity Tolerance Patient tolerated treatment well    Behavior During Therapy WFL for tasks assessed/performed             Past Medical History:  Diagnosis Date   Asthma    well controlled; never on scheduled inhalers   HSV2    Hypertension    Migraines with aura    Past Surgical History:  Procedure Laterality Date   TONSILLECTOMY     Patient Active Problem List   Diagnosis Date Noted   Recurrent tonsillitis 09/12/2018   Morbid obesity (HCC) 03/22/2018   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   Migraine with aura and without status migrainosus, not intractable 12/19/2017   PMDD (premenstrual dysphoric disorder) 12/19/2017   Seasonal allergic rhinitis due to pollen 12/19/2017   Essential hypertension 12/19/2017   Marijuana user 12/19/2017   Pure hypercholesterolemia 12/19/2017    PCP: Jarold Motto  REFERRING PROVIDER: Rodolph Bong, MD   REFERRING DIAG: [phone] (ICD-10-CM) - Acute left ankle pain   THERAPY DIAG:  Pain in left ankle and joints of left foot  Stiffness of left ankle, not elsewhere classified  Difficulty in walking, not elsewhere classified  Localized edema  Other abnormalities of gait and mobility  Rationale for Evaluation and Treatment Rehabilitation  ONSET DATE: 12/25/21  SUBJECTIVE:   SUBJECTIVE STATEMENT: L ankle pain since 12/25/21 when she stepped off the edge of her driveway and rolled her ankle. Pt states she's out of the boot at home. Mostly uses boot when she's out in the community. Pt reports she's no longer icing but keeps it  elevated. It still gets swollen. Has discussed transitioning to a compression sleeve  PERTINENT HISTORY: HTN, obesity  PAIN:  Are you having pain? Yes: NPRS scale: 5/10 Pain location: lateral ankle Pain description: Throbbing/aching Aggravating factors: Going up/down stairs, pressure on outside of ankle Relieving factors: Elevation  PRECAUTIONS: None  WEIGHT BEARING RESTRICTIONS No  FALLS:  Has patient fallen in last 6 months? Yes. Number of falls 1 -- fell taking out trash  LIVING ENVIRONMENT: Lives with: lives alone Lives in: House/apartment Stairs: Yes: Internal: 1 full flight steps; on right going up Has following equipment at home: None Uses boot  OCCUPATION: [employer]  PLOF: Independent  PATIENT GOALS Improve pain with mobility    OBJECTIVE:   DIAGNOSTIC FINDINGS: Diagnostic Limited MSK US Impression: Lateral ankle effusion and peroneal tenosynovitis present.   PATIENT SURVEYS:  FOTO 43; predicted 2  COGNITION:  Overall cognitive status: Within functional limits for tasks assessed     SENSATION: N/T top of foot   POSTURE: No Significant postural limitations  PALPATION: TTP anterior and distal lateral malleolus  LOWER EXTREMITY ROM:  Active ROM Right eval Left eval  Hip flexion    Hip extension    Hip abduction    Hip adduction    Hip internal rotation    Hip external rotation    Knee flexion    Knee extension    Ankle dorsiflexion 15 15  painful on lateral ankle   Ankle plantarflexion 30 31  Ankle inversion 35 15 limited by pain   Ankle eversion 15 10 limited by pain    (Blank rows = not tested)  LOWER EXTREMITY MMT:  MMT Right eval Left eval  Hip flexion    Hip extension    Hip abduction    Hip adduction    Hip internal rotation    Hip external rotation    Knee flexion    Knee extension    Ankle dorsiflexion  4/5  Ankle plantarflexion  4/5  Ankle inversion  4-/5 limited due to pain  Ankle eversion  4-/5 limited  due to pain   (Blank rows = not tested)   FUNCTIONAL TESTS:  SLS: 2 sec on L, 30 sec on R   GAIT: Distance walked: 50 Assistive device utilized: None Level of assistance: Complete Independence Comments: Antalgic on L. Decreased toe extension and DF, ankle in slight ER.     TODAY'S TREATMENT: See HEP   PATIENT EDUCATION:  Education details:  Exam findings, POC, initial HEP Person educated: Patient Education method: Explanation, Verbal cues, and Handouts Education comprehension: verbalized understanding and returned demonstration   HOME EXERCISE PROGRAM: Access Code: [email or web address] URL: [email or web address] Date: 01/28/2022 Prepared by: Vernon Prey April Kirstie Peri  Exercises - Isometric Ankle Eversion at Wall  - 1 x daily - 7 x weekly - 2 sets - 10 reps - 5 sec hold - Isometric Ankle Inversion  - 1 x daily - 7 x weekly - 2 sets - 10 reps - 5 sec hold - Isometric Ankle Dorsiflexion and Plantarflexion  - 1 x daily - 7 x weekly - 2 sets - 10 reps - 5 sec hold  ASSESSMENT:  CLINICAL IMPRESSION: Patient is a 36 y.o. F who was seen today for physical therapy evaluation and treatment for L lateral ankle pain. [name] found s/s of peroneal tenosynovitis. Assessment significant for limited ankle ROM, decreased strength, decreased L LE weight bearing, antalgic gait, and pain affecting community mobility. Pt is currently in cam boot but encouraged to transition to ankle compression sleeve. Initiated gentle ankle isometrics and trial of K-tape. Pt would benefit from continued PT to address these issues for full L ankle ROM and strength.    OBJECTIVE IMPAIRMENTS Abnormal gait, decreased balance, decreased mobility, difficulty walking, decreased ROM, decreased strength, increased fascial restrictions, and pain.   ACTIVITY LIMITATIONS standing, squatting, stairs, transfers, and locomotion level  PARTICIPATION LIMITATIONS: cleaning, laundry, shopping, and community  activity  PERSONAL FACTORS Age, Past/current experiences, and Time since onset of injury/illness/exacerbation are also affecting patient's functional outcome.   REHAB POTENTIAL: Good  CLINICAL DECISION MAKING: Stable/uncomplicated  EVALUATION COMPLEXITY: Low   GOALS: Goals reviewed with patient? Yes  SHORT TERM GOALS: Target date: 02/18/2022  Pt will be ind with initial HEP Baseline: Goal status: INITIAL  2.  Pt will be ind in managing L ankle pain Baseline:  Goal status: INITIAL    LONG TERM GOALS: Target date: 03/11/2022   Pt will be ind with progressing HEP Baseline:  Goal status: INITIAL  2.  Pt will be able to perform SLS for 30 sec on L LE to demo improved single leg stability and weightbearing Baseline:  Goal status: INITIAL  3.  Pt will report reduction of pain by >/=50% during all activities (especially stair ascent/descent) Baseline:  Goal status: INITIAL  4.  Pt will have full pain free ankle AROM Baseline:  Goal status: INITIAL  5.  Pt  will demo normal reciprocal gait x 1000' for community mobility Baseline:  Goal status: INITIAL  6.  Pt will have improved FOTO score to at least 64 Baseline:  Goal status: INITIAL   PLAN: PT FREQUENCY: 1x/week  PT DURATION: 6 weeks  PLANNED INTERVENTIONS: Therapeutic exercises, Therapeutic activity, Neuromuscular re-education, Balance training, Gait training, Patient/Family education, Self Care, Joint mobilization, Aquatic Therapy, Cryotherapy, Moist heat, Taping, Ultrasound, Manual therapy, and Re-evaluation  PLAN FOR NEXT SESSION: Assess response to HEP. Gentle grade I to II ankle mobilizations for edema. Continue manual work as indicated. Progress ankle strengthening as tolerated. Did she get ankle compression sleeve? How was taping?   Tyneisha Hegeman April Ma L Raeanna Soberanes, PT, DPT 01/28/2022, 3:00 PM

## 2022-02-11 ENCOUNTER — Encounter: Payer: Self-pay | Admitting: Physical Therapy

## 2022-02-11 ENCOUNTER — Ambulatory Visit: Payer: No Typology Code available for payment source | Admitting: Physical Therapy

## 2022-02-11 DIAGNOSIS — M25572 Pain in left ankle and joints of left foot: Secondary | ICD-10-CM

## 2022-02-11 DIAGNOSIS — R2689 Other abnormalities of gait and mobility: Secondary | ICD-10-CM

## 2022-02-11 DIAGNOSIS — R600 Localized edema: Secondary | ICD-10-CM

## 2022-02-11 DIAGNOSIS — M25672 Stiffness of left ankle, not elsewhere classified: Secondary | ICD-10-CM

## 2022-02-11 DIAGNOSIS — R262 Difficulty in walking, not elsewhere classified: Secondary | ICD-10-CM

## 2022-02-11 NOTE — Therapy (Signed)
OUTPATIENT PHYSICAL THERAPY LOWER EXTREMITY EVALUATION   Patient Name: Brandi Navarro MRN: [id number] DOB:1986-04-09, 36 y.o., female Today's Date: 02/11/2022   PT End of Session - 02/11/22 1354     Visit Number 2    Number of Visits 6    Date for PT Re-Evaluation 03/11/22    PT Start Time 1315    PT Stop Time 1354    PT Time Calculation (min) 39 min    Activity Tolerance Patient tolerated treatment well    Behavior During Therapy [facility] for tasks assessed/performed              Past Medical History:  Diagnosis Date   Asthma    well controlled; never on scheduled inhalers   HSV2    Hypertension    Migraines with aura    Past Surgical History:  Procedure Laterality Date   TONSILLECTOMY     Patient Active Problem List   Diagnosis Date Noted   Recurrent tonsillitis 09/12/2018   Morbid obesity (HCC) 03/22/2018   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   Migraine with aura and without status migrainosus, not intractable 12/19/2017   PMDD (premenstrual dysphoric disorder) 12/19/2017   Seasonal allergic rhinitis due to pollen 12/19/2017   Essential hypertension 12/19/2017   Marijuana user 12/19/2017   Pure hypercholesterolemia 12/19/2017    PCP: Jarold Motto  REFERRING PROVIDER: Rodolph Bong, MD   REFERRING DIAG: [phone] (ICD-10-CM) - Acute left ankle pain   THERAPY DIAG:  Pain in left ankle and joints of left foot  Stiffness of left ankle, not elsewhere classified  Difficulty in walking, not elsewhere classified  Localized edema  Other abnormalities of gait and mobility  Rationale for Evaluation and Treatment Rehabilitation  ONSET DATE: 12/25/21  SUBJECTIVE:   SUBJECTIVE STATEMENT: Pt states she is happy to no longer be in the boot. She has been wearing her ankle brace and it helps reduce swelling  PERTINENT HISTORY: HTN, obesity  PAIN:  Are you having pain? Yes: NPRS scale: 1/10 Pain location: lateral ankle Pain description:  Throbbing/aching Aggravating factors: Going up/down stairs, pressure on outside of ankle Relieving factors: Elevation  PRECAUTIONS: None  WEIGHT BEARING RESTRICTIONS No  PATIENT GOALS Improve pain with mobility    OBJECTIVE:   LOWER EXTREMITY ROM:  Active ROM Right eval Left eval  Hip flexion    Hip extension    Hip abduction    Hip adduction    Hip internal rotation    Hip external rotation    Knee flexion    Knee extension    Ankle dorsiflexion 15 15 painful on lateral ankle   Ankle plantarflexion 30 31  Ankle inversion 35 15 limited by pain   Ankle eversion 15 10 limited by pain    (Blank rows = not tested)  LOWER EXTREMITY MMT:  MMT Right eval Left eval  Hip flexion    Hip extension    Hip abduction    Hip adduction    Hip internal rotation    Hip external rotation    Knee flexion    Knee extension    Ankle dorsiflexion  4/5  Ankle plantarflexion  4/5  Ankle inversion  4-/5 limited due to pain  Ankle eversion  4-/5 limited due to pain   (Blank rows = not tested)    TODAY'S TREATMENT: Recumbent bike x 5 min level 1  Ankle 4 way red TB 2 x 10 Towel scrunch 2 x 1 min Toe yoga x 10 Tandem  stance on foam 2 x 30 sec SLS on foam on Lt with tapping 3 ways Rocker board x 1 min laterally and then A/P Side step red TB around feet 20 steps each direction Isometric inversion with ball squeeze x 10 3 sec hold  Manual:   PROM Lt ankle all directions to tolerance   PATIENT EDUCATION:  Education details:  Exam findings, POC, initial HEP Person educated: Patient Education method: Explanation, Verbal cues, and Handouts Education comprehension: verbalized understanding and returned demonstration   HOME EXERCISE PROGRAM: Access Code: [email or web address] URL: [email or web address] Date: 01/28/2022 Prepared by: Vernon Prey April Kirstie Peri  Exercises - Isometric Ankle Eversion at Wall  - 1 x daily - 7 x weekly - 2 sets - 10 reps - 5 sec hold -  Isometric Ankle Inversion  - 1 x daily - 7 x weekly - 2 sets - 10 reps - 5 sec hold - Isometric Ankle Dorsiflexion and Plantarflexion  - 1 x daily - 7 x weekly - 2 sets - 10 reps - 5 sec hold  ASSESSMENT:  CLINICAL IMPRESSION: Pt with continued pain in the sides of her ankle with end ranges of motion. Good tolerance to manual PROM and strengthening today. Added ankle 4 way with red TB to HEP    GOALS: Goals reviewed with patient? Yes  SHORT TERM GOALS: Target date: 03/04/2022  Pt will be ind with initial HEP Baseline: Goal status: INITIAL  2.  Pt will be ind in managing L ankle pain Baseline:  Goal status: INITIAL    LONG TERM GOALS: Target date: 03/25/2022   Pt will be ind with progressing HEP Baseline:  Goal status: INITIAL  2.  Pt will be able to perform SLS for 30 sec on L LE to demo improved single leg stability and weightbearing Baseline:  Goal status: INITIAL  3.  Pt will report reduction of pain by >/=50% during all activities (especially stair ascent/descent) Baseline:  Goal status: INITIAL  4.  Pt will have full pain free ankle AROM Baseline:  Goal status: INITIAL  5.  Pt will demo normal reciprocal gait x 1000' for community mobility Baseline:  Goal status: INITIAL  6.  Pt will have improved FOTO score to at least 64 Baseline:  Goal status: INITIAL   PLAN: PT FREQUENCY: 1x/week  PT DURATION: 6 weeks  PLANNED INTERVENTIONS: Therapeutic exercises, Therapeutic activity, Neuromuscular re-education, Balance training, Gait training, Patient/Family education, Self Care, Joint mobilization, Aquatic Therapy, Cryotherapy, Moist heat, Taping, Ultrasound, Manual therapy, and Re-evaluation  PLAN FOR NEXT SESSION: standing ankle strength, balance   Janett Kamath, PT, DPT 02/11/2022, 1:55 PM

## 2022-02-17 NOTE — Progress Notes (Signed)
36 y.o. G1P0010 Single White or Caucasian Not Hispanic or Latino female here for annual exam.  Patient states that she had an abortion at the beginning of the year.  Sexually active, new partner in the last 2-3 months.  Period Cycle (Days): 28 Period Duration (Days): 6-7 Period Pattern: Regular Menstrual Flow: Heavy Menstrual Control: Tampon Menstrual Control Change Freq (Hours): 1-1.5 Dysmenorrhea: (!) Moderate Dysmenorrhea Symptoms: Cramping, Headache, Diarrhea, Nausea Uses a super tampon.   She was evaluated in 8/21 for menometrorrhagia. Normal CBC, TSH, prolactin, pap, ultrasound with PCOS appearing ovaries (otherwise normal).  H/O HTN and migraines with aura.  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  Patient's last menstrual period was 02/13/2022.          Sexually active: Yes.    The current method of family planning is condoms sometimes.    Exercising: No.  Exercise is limited by orthopedic condition(s): foot injury . Smoker:  no  Health Maintenance: Pap:  02/14/20 WNL; 01/11/19 WNL HPV Neg,   12/10/16 WNL  History of abnormal Pap:  no MMG:  11/28/13 rt breast u/s Bi-rads 1 neg  BMD:   none Colonoscopy: none  TDaP:  12/10/16 Gardasil: complete    reports that she has quit smoking. Her smoking use included e-cigarettes. She has never used smokeless tobacco. She reports current alcohol use of about 2.0 - 3.0 standard drinks of alcohol per week. She reports current drug use. Drug: Marijuana. Works at [employer]. 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  Past Medical History:  Diagnosis Date   Asthma    well controlled; never on scheduled inhalers   HSV2    Hypertension    Migraines with aura     Past Surgical History:  Procedure Laterality Date   TONSILLECTOMY      Current Outpatient Medications  Medication Sig Dispense Refill   citalopram (CELEXA) 20 MG tablet TAKE 1 TABLET BY MOUTH ONCE  A DAY,   avoid Ibuprofen with this medication, may cause GI bleeding 90 tablet 4   IBUPROFEN PO Take by mouth.     loratadine (CLARITIN) 10 MG tablet Take 10 mg by mouth daily.     LORazepam (ATIVAN) 0.5 MG tablet Take 1 tablet (0.5 mg total) by mouth at bedtime. 30 tablet 0   losartan-hydrochlorothiazide (HYZAAR) 50-12.5 MG tablet Take 1 tablet by mouth once daily 90 tablet 0   Multiple Vitamins-Minerals (MULTIVITAMIN PO) Take by mouth daily.     valACYclovir (VALTREX) 500 MG tablet One daily for suppression, increase to two daily at outbreak for 3 days 90 tablet 4   phentermine (ADIPEX-P) 37.5 MG tablet Take 1 tablet (37.5 mg total) by mouth daily before breakfast. (Patient not taking: Reported on 02/24/2022) 30 tablet 2   No current facility-administered medications for this visit.    Family History  Problem Relation Age of Onset   Anemia Mother    Hypertension Father    Heart attack Father    Anemia Sister    Anemia Maternal Grandmother    Diabetes Maternal Grandfather    Hypertension Maternal Grandfather    Prostate cancer Other    Thyroid cancer Paternal Aunt     Review of Systems  All other systems reviewed and are negative.   Exam:   BP 130/80   Pulse 77   Resp 16   Ht 5\' 4"  (1.626 m)   Wt 281 lb (127.5 kg) Comment:  has on ortho boot.  LMP 02/13/2022   BMI 48.23 kg/m   Weight change: @WEIGHTCHANGE @ Height:   Height: 5\' 4"  (162.6 cm)  Ht Readings from Last 3 Encounters:  02/24/22 5\' 4"  (1.626 m)  02/23/22 5\' 4"  (1.626 m)  01/22/22 5\' 4"  (1.626 m)    General appearance: alert, cooperative and appears stated age Head: Normocephalic, without obvious abnormality, atraumatic Neck: no adenopathy, supple, symmetrical, trachea midline and thyroid normal to inspection and palpation Lungs: clear to auscultation bilaterally Cardiovascular: regular rate and rhythm Breasts: normal appearance, no masses or tenderness Abdomen: soft, non-tender; non distended,  no masses,  no  organomegaly Extremities: extremities normal, atraumatic, no cyanosis or edema Skin: Skin color, texture, turgor normal. No rashes or lesions Lymph nodes: Cervical, supraclavicular, and axillary nodes normal. No abnormal inguinal nodes palpated Neurologic: Grossly normal   Pelvic: External genitalia:  no lesions              Urethra:  normal appearing urethra with no masses, tenderness or lesions              Bartholins and Skenes: normal                 Vagina: normal appearing vagina with normal color and discharge, no lesions              Cervix: no lesions               Bimanual Exam:  Uterus:   no masses or tenderness              Adnexa: no mass, fullness, tenderness               Rectovaginal: Confirms               Anus:  normal sphincter tone, no lesions  chaperoned for the exam.      1. Well woman exam No pap this year Discussed breast self exam Discussed calcium and vit D intake Labs with primary  2. History of herpes simplex type 2 infection Well controlled on suppression - valACYclovir (VALTREX) 500 MG tablet; One daily for suppression, increase to two daily at outbreak for 3 days  Dispense: 90 tablet; Refill: 4  3. Depression with anxiety Doing well - citalopram (CELEXA) 20 MG tablet; TAKE 1 TABLET BY MOUTH ONCE A DAY,   avoid Ibuprofen with this medication, may cause GI bleeding  Dispense: 90 tablet; Refill: 4  4. Screening examination for STD (sexually transmitted disease) - RPR - HIV Antibody (routine testing w rflx) - Hepatitis C antibody - SURESWAB CT/NG/T. vaginalis  5. BMI 45.0-49.9, adult [facility]) On medication with her primary  6. General counseling and advice on female contraception Not a candidate for OCP's, hasn't tolerated progesterone only methods. Had a kyleena in for less than 24 hours secondary to pain. Discussed option of retrying a kyleena (wants minimal systemic side effects). If she decides on a kyleena, would pretreat with  cytotec, ibuprofen and place with ultrasound guidance.

## 2022-02-23 ENCOUNTER — Ambulatory Visit (INDEPENDENT_AMBULATORY_CARE_PROVIDER_SITE_OTHER): Payer: Self-pay

## 2022-02-23 ENCOUNTER — Ambulatory Visit: Payer: No Typology Code available for payment source | Admitting: Family Medicine

## 2022-02-23 VITALS — BP 150/100 | HR 82 | Ht 64.0 in | Wt 281.4 lb

## 2022-02-23 DIAGNOSIS — M25571 Pain in right ankle and joints of right foot: Secondary | ICD-10-CM

## 2022-02-23 DIAGNOSIS — M25572 Pain in left ankle and joints of left foot: Secondary | ICD-10-CM

## 2022-02-23 NOTE — Progress Notes (Unsigned)
I, Philbert Riser, LAT, ATC acting as a scribe for Clementeen Graham, MD.  Brandi Navarro is a 36 y.o. female who presents to [facility] today for R ankle pain. Pt was last seen by Dr. Denyse Amass on 01/22/22 for a L ankle sprain and was advised to wean out of the CAM walker boot, wear a compressive ankle sleeve and was referred to PT, completing 2 visits. Today, pt reports she suffered another fall on 8/26, Saturday injuring her R ankle. Pt was walking out of [employer] and stepped into a pot hole and and rolled her R ankle into INV.   R ankle swelling: yes Treatments tried: compression wrap, ice, elevation, IBU  Dx imaging: 01/22/22 L ankle XR  Pertinent review of systems: No fevers or chills  Relevant historical information: Migraine headache, hypertension.   Exam:  BP (!) 150/100   Pulse 82   Ht 5\' 4"  (1.626 m)   Wt 281 lb 6.4 oz (127.6 kg)   SpO2 96%   BMI 48.30 kg/m  General: Well Developed, well nourished, and in no acute distress.   MSK: Right ankle: Swollen at lateral ankle. Tender palpation at lateral malleolus. Decreased ankle motion. Stable ligamentous exam. Intact strength. Mild antalgic gait.  Right knee nontender with normal motion.    Lab and Radiology Results No results found for this or any previous visit (from the past 72 hour(s)). DG Ankle Complete Right  Result Date: 02/23/2022 CLINICAL DATA:  Patient c/o right ankle pain x 3 days after mis-stepping into a pothole. EXAM: RIGHT ANKLE - COMPLETE 3+ VIEW COMPARISON:  None Available. FINDINGS: There is no evidence of fracture, dislocation, or joint effusion. There is no evidence of arthropathy or other focal bone abnormality. Soft tissues are unremarkable. IMPRESSION: Negative. Electronically Signed   By: 02/25/2022 M.D.   On: 02/23/2022 17:41    I, 02/25/2022, personally (independently) visualized and performed the interpretation of the images attached in this  note.    Assessment and Plan: 36 y.o. female with right ankle sprain.  This occurs in the setting of a relatively recent left ankle sprain.  She is just getting started with physical therapy for her left ankle so we can add in right ankle as well.  The treatment will be the same.  Recommend ASO ankle brace and if really needed a cam walker boot.  PT and home exercise program will be helpful.  Check back in 1 month.  Of note the left ankle is doing pretty well.  Continue PT for this and we can reschedule the upcoming visit that was originally scheduled for 1 week and consolidate both problems for the 1 month follow-up visit for the right ankle.   PDMP not reviewed this encounter. Orders Placed This Encounter  Procedures   DG Ankle Complete Right    Standing Status:   Future    Number of Occurrences:   1    Standing Expiration Date:   02/24/2023    Order Specific Question:   Reason for Exam (SYMPTOM  OR DIAGNOSIS REQUIRED)    Answer:   right ankle pain    Order Specific Question:   Preferred imaging location?    Answer:   02/26/2023    Order Specific Question:   Is patient pregnant?    Answer:   No   Ambulatory referral to Physical Therapy    Referral Priority:   Routine    Referral Type:  Physical Medicine    Referral Reason:   Specialty Services Required    Requested Specialty:   Physical Therapy    Number of Visits Requested:   1   No orders of the defined types were placed in this encounter.    Discussed warning signs or symptoms. Please see discharge instructions. Patient expresses understanding.   The above documentation has been reviewed and is accurate and complete Clementeen Graham, M.D.

## 2022-02-23 NOTE — Patient Instructions (Addendum)
Thank you for coming in today.   I've referred you to Physical Therapy.  Let us know if you don't hear from them in one week.   Recheck in about 1 month.   Let me know if this is not going ok.   Knee scooter could help. Medical supply companies typically will rent them.

## 2022-02-24 ENCOUNTER — Ambulatory Visit (INDEPENDENT_AMBULATORY_CARE_PROVIDER_SITE_OTHER): Payer: No Typology Code available for payment source | Admitting: Obstetrics and Gynecology

## 2022-02-24 ENCOUNTER — Encounter: Payer: Self-pay | Admitting: Obstetrics and Gynecology

## 2022-02-24 VITALS — BP 130/80 | HR 77 | Resp 16 | Ht 64.0 in | Wt 281.0 lb

## 2022-02-24 DIAGNOSIS — Z6841 Body Mass Index (BMI) 40.0 and over, adult: Secondary | ICD-10-CM | POA: Diagnosis not present

## 2022-02-24 DIAGNOSIS — F418 Other specified anxiety disorders: Secondary | ICD-10-CM

## 2022-02-24 DIAGNOSIS — Z113 Encounter for screening for infections with a predominantly sexual mode of transmission: Secondary | ICD-10-CM

## 2022-02-24 DIAGNOSIS — Z8619 Personal history of other infectious and parasitic diseases: Secondary | ICD-10-CM

## 2022-02-24 DIAGNOSIS — Z01419 Encounter for gynecological examination (general) (routine) without abnormal findings: Secondary | ICD-10-CM | POA: Diagnosis not present

## 2022-02-24 DIAGNOSIS — Z3009 Encounter for other general counseling and advice on contraception: Secondary | ICD-10-CM

## 2022-02-24 MED ORDER — VALACYCLOVIR HCL 500 MG PO TABS
ORAL_TABLET | ORAL | 4 refills | Status: AC
Start: 2022-02-24 — End: ?

## 2022-02-24 MED ORDER — CITALOPRAM HYDROBROMIDE 20 MG PO TABS
ORAL_TABLET | ORAL | 4 refills | Status: DC
Start: 2022-02-24 — End: 2023-06-07

## 2022-02-24 NOTE — Progress Notes (Signed)
Right ankle x-ray shows no fracture.

## 2022-02-24 NOTE — Patient Instructions (Signed)

## 2022-02-25 ENCOUNTER — Ambulatory Visit: Payer: No Typology Code available for payment source | Admitting: Physical Therapy

## 2022-02-25 ENCOUNTER — Encounter: Payer: Self-pay | Admitting: Physical Therapy

## 2022-02-25 DIAGNOSIS — M25572 Pain in left ankle and joints of left foot: Secondary | ICD-10-CM | POA: Diagnosis not present

## 2022-02-25 DIAGNOSIS — R600 Localized edema: Secondary | ICD-10-CM

## 2022-02-25 DIAGNOSIS — R262 Difficulty in walking, not elsewhere classified: Secondary | ICD-10-CM

## 2022-02-25 DIAGNOSIS — M25672 Stiffness of left ankle, not elsewhere classified: Secondary | ICD-10-CM

## 2022-02-25 DIAGNOSIS — R2689 Other abnormalities of gait and mobility: Secondary | ICD-10-CM

## 2022-02-25 LAB — SURESWAB CT/NG/T. VAGINALIS
C. trachomatis RNA, TMA: DETECTED — AB
N. gonorrhoeae RNA, TMA: NOT DETECTED
Trichomonas vaginalis RNA: NOT DETECTED

## 2022-02-25 LAB — HIV ANTIBODY (ROUTINE TESTING W REFLEX): HIV 1&2 Ab, 4th Generation: NONREACTIVE

## 2022-02-25 LAB — RPR: RPR Ser Ql: NONREACTIVE

## 2022-02-25 LAB — HEPATITIS C ANTIBODY: Hepatitis C Ab: NONREACTIVE

## 2022-02-25 NOTE — Therapy (Signed)
OUTPATIENT PHYSICAL THERAPY   Patient Name: Brandi Navarro MRN: [id number] DOB:March 10, 1986, 36 y.o., female Today's Date: 02/25/2022   PT End of Session - 02/25/22 1444     Visit Number 3    Number of Visits 6    Date for PT Re-Evaluation 03/11/22    PT Start Time 1355    PT Stop Time 1435    PT Time Calculation (min) 40 min    Activity Tolerance Patient tolerated treatment well    Behavior During Therapy [facility] for tasks assessed/performed               Past Medical History:  Diagnosis Date   Asthma    well controlled; never on scheduled inhalers   HSV2    Hypertension    Migraines with aura    Past Surgical History:  Procedure Laterality Date   TONSILLECTOMY     Patient Active Problem List   Diagnosis Date Noted   Recurrent tonsillitis 09/12/2018   Morbid obesity (HCC) 03/22/2018   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   Migraine with aura and without status migrainosus, not intractable 12/19/2017   PMDD (premenstrual dysphoric disorder) 12/19/2017   Seasonal allergic rhinitis due to pollen 12/19/2017   Essential hypertension 12/19/2017   Marijuana user 12/19/2017   Pure hypercholesterolemia 12/19/2017    PCP: Jarold Motto  REFERRING PROVIDER: Rodolph Bong, MD   REFERRING DIAG: [phone] (ICD-10-CM) - Acute left ankle pain   THERAPY DIAG:  Pain in left ankle and joints of left foot  Difficulty in walking, not elsewhere classified  Stiffness of left ankle, not elsewhere classified  Other abnormalities of gait and mobility  Localized edema  Rationale for Evaluation and Treatment Rehabilitation  ONSET DATE: 12/25/21  SUBJECTIVE:   SUBJECTIVE STATEMENT: Pt states she stepped in a pothole Saturday and sprained her Rt ankle. She saw Dr. Denyse Amass who recommended boot and then beginning PT for Rt ankle as well as Lt. Pt states her Rt ankle is still very bruised and painful and she would like to wait until next visit to begin PT for Rt  ankle  PERTINENT HISTORY: HTN, obesity  PAIN:  Are you having pain? Yes: NPRS scale: 1/10 Pain location: Lt lateral ankle Pain description: Throbbing/aching Aggravating factors: Going up/down stairs, pressure on outside of ankle Relieving factors: Elevation  PRECAUTIONS: None  WEIGHT BEARING RESTRICTIONS No  PATIENT GOALS Improve pain with mobility    OBJECTIVE:   LOWER EXTREMITY ROM:  Active ROM Right eval Left eval Left 8/30  Hip flexion     Hip extension     Hip abduction     Hip adduction     Hip internal rotation     Hip external rotation     Knee flexion     Knee extension     Ankle dorsiflexion 15 15 painful on lateral ankle    Ankle plantarflexion 30 31   Ankle inversion 35 15 limited by pain  23 - no pain  Ankle eversion 15 10 limited by pain  10- no pain   (Blank rows = not tested)  LOWER EXTREMITY MMT:  MMT Right eval Left eval  Hip flexion    Hip extension    Hip abduction    Hip adduction    Hip internal rotation    Hip external rotation    Knee flexion    Knee extension    Ankle dorsiflexion  4/5  Ankle plantarflexion  4/5  Ankle inversion  4-/5 limited  due to pain  Ankle eversion  4-/5 limited due to pain   (Blank rows = not tested)    TODAY'S TREATMENT: 02/25/22 Recumbent bike x 5 min level 1  Ankle 4 way red TB 2 x 10 BAPS level 2 x 10 circles CW/CCW Tandem stance on foam 2 x 30 sec bilat SLS on foam Lt LE 2 x 20 sec without UE support Standing tapping 3 ways Lt LE on ground (painful on foam today) Step ups 2 x10 6'' step Lt LE Isometric PF, isometric inv both x 20   02/11/22 Recumbent bike x 5 min level 1  Ankle 4 way red TB 2 x 10 Towel scrunch 2 x 1 min Toe yoga x 10 Tandem stance on foam 2 x 30 sec SLS on foam on Lt with tapping 3 ways Rocker board x 1 min laterally and then A/P Side step red TB around feet 20 steps each direction Isometric inversion with ball squeeze x 10 3 sec hold  Manual:   PROM Lt ankle  all directions to tolerance   PATIENT EDUCATION:  Education details:  Exam findings, POC, initial HEP Person educated: Patient Education method: Explanation, Verbal cues, and Handouts Education comprehension: verbalized understanding and returned demonstration   HOME EXERCISE PROGRAM: Access Code: [email or web address] URL: [email or web address] Date: 01/28/2022 Prepared by: Vernon Prey April Kirstie Peri  Exercises - Isometric Ankle Eversion at Wall  - 1 x daily - 7 x weekly - 2 sets - 10 reps - 5 sec hold - Isometric Ankle Inversion  - 1 x daily - 7 x weekly - 2 sets - 10 reps - 5 sec hold - Isometric Ankle Dorsiflexion and Plantarflexion  - 1 x daily - 7 x weekly - 2 sets - 10 reps - 5 sec hold  ASSESSMENT:  CLINICAL IMPRESSION: PT educated pt on importance of continuing AROM of Rt ankle as much as possible to reduce swelling and prevent stiffness due to wearing boot.  Progressed Lt ankle as tolerated and pt is improving ROM without pain. Unable to begin standing strengthening due to Rt ankle injury    GOALS: Goals reviewed with patient? Yes  SHORT TERM GOALS: Target date: Pt will be ind with initial HEP Baseline: Goal status: INITIAL  2.  Pt will be ind in managing L ankle pain Baseline:  Goal status: INITIAL    LONG TERM GOALS:   Pt will be ind with progressing HEP Baseline:  Goal status: INITIAL  2.  Pt will be able to perform SLS for 30 sec on L LE to demo improved single leg stability and weightbearing Baseline:  Goal status: INITIAL  3.  Pt will report reduction of pain by >/=50% during all activities (especially stair ascent/descent) Baseline:  Goal status: INITIAL  4.  Pt will have full pain free ankle AROM Baseline:  Goal status: INITIAL  5.  Pt will demo normal reciprocal gait x 1000' for community mobility Baseline:  Goal status: INITIAL  6.  Pt will have improved FOTO score to at least 64 Baseline:  Goal status: INITIAL   PLAN: PT  FREQUENCY: 1x/week  PT DURATION: 6 weeks  PLANNED INTERVENTIONS: Therapeutic exercises, Therapeutic activity, Neuromuscular re-education, Balance training, Gait training, Patient/Family education, Self Care, Joint mobilization, Aquatic Therapy, Cryotherapy, Moist heat, Taping, Ultrasound, Manual therapy, and Re-evaluation  PLAN FOR NEXT SESSION: evaluate Rt ankle   Zhaniya Swallows, PT,DPT 02/25/2022, 2:46 PM

## 2022-02-26 ENCOUNTER — Other Ambulatory Visit: Payer: Self-pay

## 2022-02-26 MED ORDER — DOXYCYCLINE HYCLATE 100 MG PO CAPS: ORAL_CAPSULE | ORAL | 0 refills | Status: DC

## 2022-03-05 ENCOUNTER — Ambulatory Visit: Payer: No Typology Code available for payment source | Admitting: Family Medicine

## 2022-03-11 ENCOUNTER — Encounter: Payer: Self-pay | Admitting: Physical Therapy

## 2022-03-11 ENCOUNTER — Ambulatory Visit: Payer: No Typology Code available for payment source | Attending: Family Medicine | Admitting: Physical Therapy

## 2022-03-11 DIAGNOSIS — M25572 Pain in left ankle and joints of left foot: Secondary | ICD-10-CM

## 2022-03-11 DIAGNOSIS — M25672 Stiffness of left ankle, not elsewhere classified: Secondary | ICD-10-CM | POA: Diagnosis present

## 2022-03-11 DIAGNOSIS — R600 Localized edema: Secondary | ICD-10-CM

## 2022-03-11 DIAGNOSIS — R2689 Other abnormalities of gait and mobility: Secondary | ICD-10-CM

## 2022-03-11 DIAGNOSIS — R262 Difficulty in walking, not elsewhere classified: Secondary | ICD-10-CM | POA: Diagnosis present

## 2022-03-11 NOTE — Therapy (Signed)
OUTPATIENT PHYSICAL THERAPY Recertification   Patient Name: Brandi Navarro MRN: [id number] DOB:[birth year], 36 y.o., female Today's Date: 03/11/2022   PT End of Session - 03/11/22 1350     Visit Number 4    Number of Visits 10    Date for PT Re-Evaluation 04/22/22    PT Start Time 1315    PT Stop Time 1350    PT Time Calculation (min) 35 min    Activity Tolerance Patient tolerated treatment well    Behavior During Therapy [facility] for tasks assessed/performed                Past Medical History:  Diagnosis Date   Asthma    well controlled; never on scheduled inhalers   HSV2    Hypertension    Migraines with aura    Past Surgical History:  Procedure Laterality Date   TONSILLECTOMY     Patient Active Problem List   Diagnosis Date Noted   Recurrent tonsillitis 09/12/2018   Morbid obesity ([place]) 03/22/2018   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   Migraine with aura and without status migrainosus, not intractable 12/19/2017   PMDD (premenstrual dysphoric disorder) 12/19/2017   Seasonal allergic rhinitis due to pollen 12/19/2017   Essential hypertension 12/19/2017   Marijuana user 12/19/2017   Pure hypercholesterolemia 12/19/2017    PCP: Inda Coke  REFERRING PROVIDER: Gregor Hams, MD   REFERRING DIAG: [phone] (ICD-10-CM) - Acute left ankle pain   THERAPY DIAG:  Pain in left ankle and joints of left foot  Difficulty in walking, not elsewhere classified  Stiffness of left ankle, not elsewhere classified  Other abnormalities of gait and mobility  Localized edema  Rationale for Evaluation and Treatment Rehabilitation  ONSET DATE: 12/25/21  SUBJECTIVE:   SUBJECTIVE STATEMENT: Pt states her Rt ankle is swelling often. She is wearing the boot at all times. Her left ankle is feeling much better. Rt ankle has pain when she is moving a lot  PERTINENT HISTORY: HTN, obesity  PAIN:  Are you having pain? Yes: NPRS scale: 4/10 Pain location:  Rt lateral ankle Pain description: Throbbing/aching Aggravating factors: Going up/down stairs, pressure on outside of ankle Relieving factors: Elevation  PRECAUTIONS: None  WEIGHT BEARING RESTRICTIONS No  PATIENT GOALS Improve pain with mobility    OBJECTIVE:   LOWER EXTREMITY ROM:  Active ROM Right eval Left eval Left 8/30 Left 9/13 Right 9/13  Hip flexion       Hip extension       Hip abduction       Hip adduction       Hip internal rotation       Hip external rotation       Knee flexion       Knee extension       Ankle dorsiflexion 15 15 painful on lateral ankle    - 5  Ankle plantarflexion 30 31   32  Ankle inversion 35 15 limited by pain  23 - no pain 30 15 - pain  Ankle eversion 15 10 limited by pain  10- no pain 18 11 - pain   (Blank rows = not tested)  LOWER EXTREMITY MMT:  MMT Right eval Left eval Left 9/13  Hip flexion     Hip extension     Hip abduction     Hip adduction     Hip internal rotation     Hip external rotation     Knee flexion  Knee extension     Ankle dorsiflexion  4/5 4+/5  Ankle plantarflexion  4/5 4+5  Ankle inversion  4-/5 limited due to pain 4+/5  Ankle eversion  4-/5 limited due to pain 4+/5   (Blank rows = not tested)  Lt SLS 30 seconds  TODAY'S TREATMENT: 03/11/22 Recumbent bike x 5 level 2  Ankle 4 way yellow TB Rt ankle BAPS level 2 x 20 circles CW/CCW Rocker board A/P and laterally x 1 min each Tandem stance on foam 2 x 1 min  02/25/22 Recumbent bike x 5 min level 1  Ankle 4 way red TB 2 x 10 BAPS level 2 x 10 circles CW/CCW Tandem stance on foam 2 x 30 sec bilat SLS on foam Lt LE 2 x 20 sec without UE support Standing tapping 3 ways Lt LE on ground (painful on foam today) Step ups 2 x10 6'' step Lt LE Isometric PF, isometric inv both x 20    PATIENT EDUCATION:  Education details:  Exam findings, POC, initial HEP Person educated: Patient Education method: Explanation, Verbal cues, and  Handouts Education comprehension: verbalized understanding and returned demonstration   HOME EXERCISE PROGRAM: Access Code: [email or web address] URL: [email or web address] Date: 01/28/2022 Prepared by: Estill Bamberg April Thurnell Garbe  Exercises - Isometric Ankle Eversion at Wall  - 1 x daily - 7 x weekly - 2 sets - 10 reps - 5 sec hold - Isometric Ankle Inversion  - 1 x daily - 7 x weekly - 2 sets - 10 reps - 5 sec hold - Isometric Ankle Dorsiflexion and Plantarflexion  - 1 x daily - 7 x weekly - 2 sets - 10 reps - 5 sec hold  ASSESSMENT:  CLINICAL IMPRESSION: Pt has me goals for her Lt ankle, now working on progressing Rt ankle toward final goals. She has improved activity tolerance and balance with LT ankle and will continue to benefit from skilled PT to improve Rt ankle strength, ROM and mobility    GOALS: Goals reviewed with patient? Yes  SHORT TERM GOALS: Target date: Pt will be ind with initial HEP Baseline: Goal status: MET  2.  Pt will be ind in managing L ankle pain Baseline:  Goal status: MET    LONG TERM GOALS:  Due date: 04/22/22  Pt will be ind with progressing HEP Baseline:  Goal status: IN PROGRESS  2.  Pt will be able to perform SLS for 30 sec on L LE to demo improved single leg stability and weightbearing Baseline:  Goal status: MET  3.  Pt will report reduction of pain by >/=50% during all activities (especially stair ascent/descent) Baseline:  Goal status: MET  4.  Pt will have full pain free ankle AROM Baseline:  Goal status: MET  5.  Pt will demo normal reciprocal gait x 1000' for community mobility Baseline:  Goal status: IN PROGRESS  6.  Pt will have improved FOTO score to at least 64 Baseline:  Goal status: IN PROGRESS   PLAN: PT FREQUENCY: 1x/week  PT DURATION: 6 weeks  PLANNED INTERVENTIONS: Therapeutic exercises, Therapeutic activity, Neuromuscular re-education, Balance training, Gait training, Patient/Family education, Self  Care, Joint mobilization, Aquatic Therapy, Cryotherapy, Moist heat, Taping, Ultrasound, Manual therapy, and Re-evaluation  PLAN FOR NEXT SESSION: final visit. Finalize HEP for ankle strength in standing   Kohner Orlick, PT,DPT 03/11/2022, 1:50 PM

## 2022-03-25 ENCOUNTER — Encounter: Payer: Self-pay | Admitting: Physical Therapy

## 2022-03-25 ENCOUNTER — Ambulatory Visit: Payer: No Typology Code available for payment source | Admitting: Physical Therapy

## 2022-03-25 DIAGNOSIS — M25572 Pain in left ankle and joints of left foot: Secondary | ICD-10-CM

## 2022-03-25 DIAGNOSIS — R600 Localized edema: Secondary | ICD-10-CM

## 2022-03-25 DIAGNOSIS — R262 Difficulty in walking, not elsewhere classified: Secondary | ICD-10-CM

## 2022-03-25 DIAGNOSIS — R2689 Other abnormalities of gait and mobility: Secondary | ICD-10-CM

## 2022-03-25 NOTE — Therapy (Signed)
OUTPATIENT PHYSICAL THERAPY AND DISCHARGE   Patient Name: Brandi Navarro MRN: [id number] DOB:Jul 16, 1985, 36 y.o., female Today's Date: 03/25/2022   PT End of Session - 03/25/22 1344     Visit Number 5    Date for PT Re-Evaluation 04/22/22    PT Start Time 1315    PT Stop Time 1340    PT Time Calculation (min) 25 min    Activity Tolerance Patient tolerated treatment well    Behavior During Therapy [facility] for tasks assessed/performed                 Past Medical History:  Diagnosis Date   Asthma    well controlled; never on scheduled inhalers   HSV2    Hypertension    Migraines with aura    Past Surgical History:  Procedure Laterality Date   TONSILLECTOMY     Patient Active Problem List   Diagnosis Date Noted   Recurrent tonsillitis 09/12/2018   Morbid obesity ([place]) 03/22/2018   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   Migraine with aura and without status migrainosus, not intractable 12/19/2017   PMDD (premenstrual dysphoric disorder) 12/19/2017   Seasonal allergic rhinitis due to pollen 12/19/2017   Essential hypertension 12/19/2017   Marijuana user 12/19/2017   Pure hypercholesterolemia 12/19/2017    PCP: Inda Coke  REFERRING PROVIDER: Gregor Hams, MD   REFERRING DIAG: [phone] (ICD-10-CM) - Acute left ankle pain   THERAPY DIAG:  Pain in left ankle and joints of left foot  Difficulty in walking, not elsewhere classified  Other abnormalities of gait and mobility  Localized edema  Rationale for Evaluation and Treatment Rehabilitation  ONSET DATE: 12/25/21  SUBJECTIVE:   SUBJECTIVE STATEMENT: Pt states she is wearing a compression brace on Rt ankle instead of boot, still swelling but "not as bad". She states her left ankle is "great"  PERTINENT HISTORY: HTN, obesity  PAIN:  Are you having pain? Yes: NPRS scale: 0/10 Pain location: Rt lateral ankle Pain description: Throbbing/aching Aggravating factors: Going up/down  stairs, pressure on outside of ankle Relieving factors: Elevation  PRECAUTIONS: None  WEIGHT BEARING RESTRICTIONS No  PATIENT GOALS Improve pain with mobility    OBJECTIVE:   LOWER EXTREMITY ROM:  Active ROM Right eval Left eval Left 8/30 Left 9/13 Right 9/13  Hip flexion       Hip extension       Hip abduction       Hip adduction       Hip internal rotation       Hip external rotation       Knee flexion       Knee extension       Ankle dorsiflexion 15 15 painful on lateral ankle    - 5  Ankle plantarflexion 30 31   32  Ankle inversion 35 15 limited by pain  23 - no pain 30 15 - pain  Ankle eversion 15 10 limited by pain  10- no pain 18 11 - pain   (Blank rows = not tested)  LOWER EXTREMITY MMT:  MMT Right eval Left eval Left 9/13  Hip flexion     Hip extension     Hip abduction     Hip adduction     Hip internal rotation     Hip external rotation     Knee flexion     Knee extension     Ankle dorsiflexion  4/5 4+/5  Ankle plantarflexion  4/5 4+5  Ankle  inversion  4-/5 limited due to pain 4+/5  Ankle eversion  4-/5 limited due to pain 4+/5   (Blank rows = not tested)  Lt SLS 30 seconds  FOTO: 69 (9/27)  TODAY'S TREATMENT: 03/25/22 Recumbent bike L3 x 5 min  Heel raises x 10 each toes in, out, straight Side step red TB 5 steps x 5 bilat SLS bilat 3 x 20 sec Ankle 4 way green TB Rt x 20  03/11/22 Recumbent bike x 5 level 2  Ankle 4 way yellow TB Rt ankle BAPS level 2 x 20 circles CW/CCW Rocker board A/P and laterally x 1 min each Tandem stance on foam 2 x 1 min  02/25/22 Recumbent bike x 5 min level 1  Ankle 4 way red TB 2 x 10 BAPS level 2 x 10 circles CW/CCW Tandem stance on foam 2 x 30 sec bilat SLS on foam Lt LE 2 x 20 sec without UE support Standing tapping 3 ways Lt LE on ground (painful on foam today) Step ups 2 x10 6'' step Lt LE Isometric PF, isometric inv both x 20    PATIENT EDUCATION:  Education details:  Exam findings,  POC, initial HEP Person educated: Patient Education method: Explanation, Verbal cues, and Handouts Education comprehension: verbalized understanding and returned demonstration   HOME EXERCISE PROGRAM: Access Code: [email or web address] URL: [email or web address] Date: 03/25/2022 Prepared by: Isabelle Course  Exercises - Standing Heel Raise with Toes Turned Out  - 1 x daily - 7 x weekly - 3 sets - 10 reps - Standing Heel Raise with Toes Turned In  - 1 x daily - 7 x weekly - 3 sets - 10 reps - Standing Heel Raise  - 1 x daily - 7 x weekly - 3 sets - 10 reps - Side Stepping with Resistance at Feet  - 1 x daily - 7 x weekly - 3 sets - 10 reps - Ankle Inversion with Resistance  - 1 x daily - 7 x weekly - 3 sets - 10 reps - Ankle Eversion with Resistance  - 1 x daily - 7 x weekly - 3 sets - 10 reps  ASSESSMENT:  CLINICAL IMPRESSION: Pt has met all goals, improved balance, gait, strength and mobility and is ready for d/c to HEP.    GOALS: Goals reviewed with patient? Yes  SHORT TERM GOALS: Target date: Pt will be ind with initial HEP Baseline: Goal status: MET  2.  Pt will be ind in managing L ankle pain Baseline:  Goal status: MET    LONG TERM GOALS:  Due date: 04/22/22  Pt will be ind with progressing HEP Baseline:  Goal status: MET  2.  Pt will be able to perform SLS for 30 sec on L LE to demo improved single leg stability and weightbearing Baseline:  Goal status: MET  3.  Pt will report reduction of pain by >/=50% during all activities (especially stair ascent/descent) Baseline:  Goal status: MET  4.  Pt will have full pain free ankle AROM Baseline:  Goal status: MET  5.  Pt will demo normal reciprocal gait x 1000' for community mobility Baseline:  Goal status: MET  6.  Pt will have improved FOTO score to at least 64 Baseline:  Goal status: MET   PLAN: PT FREQUENCY: 1x/week  PT DURATION: 6 weeks  PLANNED INTERVENTIONS: Therapeutic exercises,  Therapeutic activity, Neuromuscular re-education, Balance training, Gait training, Patient/Family education, Self Care, Joint mobilization, Aquatic Therapy, Cryotherapy, Moist heat, Taping, Ultrasound, Manual therapy,  and Re-evaluation  PLAN FOR NEXT SESSION: d/c  PHYSICAL THERAPY DISCHARGE SUMMARY  Visits from Start of Care: 5  Current functional level related to goals / functional outcomes: Improved strength, mobility, gait, balance   Remaining deficits: See above   Education / Equipment: HEP   Patient agrees to discharge. Patient goals were met. Patient is being discharged due to meeting the stated rehab goals.  Nasif Bos, PT 03/25/2022, 1:45 PM

## 2022-03-26 ENCOUNTER — Ambulatory Visit: Payer: No Typology Code available for payment source | Admitting: Family Medicine

## 2022-04-16 ENCOUNTER — Other Ambulatory Visit: Payer: Self-pay | Admitting: Physician Assistant

## 2022-05-13 ENCOUNTER — Ambulatory Visit: Payer: No Typology Code available for payment source | Admitting: Obstetrics and Gynecology

## 2022-05-26 NOTE — Progress Notes (Unsigned)
GYNECOLOGY  VISIT   HPI: 36 y.o.   Single White or Caucasian Not Hispanic or Latino  female   [id number] with Patient's last menstrual period was 05/11/2022.   here for repeat testing for chlamydia. + chlamydia screen in 8/23, she and her partner were treated.  The rest of her STD testing was negative.   She would like to be tested for BV. She notices a mild odor near her cycle. No abnormal discharge, no irritation.   GYNECOLOGIC HISTORY: Patient's last menstrual period was 05/11/2022. 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   Migraine with aura and without status migrainosus, not intractable 12/19/2017   PMDD (premenstrual dysphoric disorder) 12/19/2017   Seasonal allergic rhinitis due to pollen 12/19/2017   Essential hypertension 12/19/2017   Marijuana user 12/19/2017   Pure hypercholesterolemia 12/19/2017    Past Medical History:  Diagnosis Date   Asthma    well controlled; never on scheduled inhalers   HSV2    Hypertension    Migraines with aura     Past Surgical History:  Procedure Laterality Date   TONSILLECTOMY      Current Outpatient Medications  Medication Sig Dispense Refill   citalopram (CELEXA) 20 MG tablet TAKE 1 TABLET BY MOUTH ONCE A DAY,   avoid Ibuprofen with this medication, may cause GI bleeding 90 tablet 4   IBUPROFEN PO Take by mouth.     loratadine (CLARITIN) 10 MG tablet Take 10 mg by mouth daily.     LORazepam (ATIVAN) 0.5 MG tablet Take 1 tablet (0.5 mg total) by mouth at bedtime. 30 tablet 0   losartan-hydrochlorothiazide (HYZAAR) 50-12.5 MG tablet Take 1 tablet by  mouth once daily 90 tablet 0   Multiple Vitamins-Minerals (MULTIVITAMIN PO) Take by mouth daily.     phentermine (ADIPEX-P) 37.5 MG tablet Take 1 tablet (37.5 mg total) by mouth daily before breakfast. 30 tablet 2   valACYclovir (VALTREX) 500 MG tablet One daily for suppression, increase to two daily at outbreak for 3 days 90 tablet 4   No current facility-administered medications for this visit.     ALLERGIES: Patient has no known allergies.  Family History  Problem Relation Age of Onset   Anemia Mother    Hypertension Father    Heart attack Father    Anemia Sister    Anemia Maternal Grandmother    Diabetes Maternal Grandfather    Hypertension Maternal Grandfather    Prostate cancer Other    Thyroid cancer Paternal Aunt     Social History   Socioeconomic History   Marital status: Single    Spouse name: Not on file   Number of children: Not on file   Years of education: Not on file   Highest education level: Not on file  Occupational History    Employer: [employer]  Tobacco  Use   Smoking status: Former    Types: E-cigarettes   Smokeless tobacco: Never  Vaping Use   Vaping Use: Never used  Substance and Sexual Activity   Alcohol use: Yes    Alcohol/week: 2.0 - 3.0 standard drinks of alcohol    Types: 2 - 3 Standard drinks or equivalent per week   Drug use: Yes    Types: Marijuana    Comment: daily   Sexual activity: Yes    Partners: Male    Birth control/protection: Condom    Comment: condoms occ  Other Topics Concern   Not on file  Social History Narrative   Works at [employer] -- almost 5 years   No kids   Single   Social Determinants of Corporate investment banker Strain: Not on file  Food Insecurity: Not on file  Transportation Needs: Not on file  Physical Activity: Not on file  Stress: Not on file  Social Connections: Not on file  Intimate Partner Violence: Not on file    Review of Systems  All other systems reviewed and are  negative.   PHYSICAL EXAMINATION:    BP 138/80 (BP Location: Right Arm, Patient Position: Sitting, Cuff Size: Large)   Pulse 78   Ht 5\' 4"  (1.626 m)   Wt 281 lb (127.5 kg)   LMP 05/11/2022   BMI 48.23 kg/m     General appearance: alert, cooperative and appears stated age   Pelvic: External genitalia:  no lesions              Urethra:  normal appearing urethra with no masses, tenderness or lesions              Bartholins and Skenes: normal                 Vagina: normal appearing vagina with a slight increase in watery, white vaginal d/c with bubbles              Cervix: no cervical motion tenderness and no lesions              Bimanual Exam:  Uterus:   no masses or tenderness                Chaperone was present for exam.  1. History of chlamydia Treated Retested today Given information on chlamydia, discussed risk of ectopic pregnancy and need for early monitoring if she does get pregnant.   2. Screening examination for STD (sexually transmitted disease) - SURESWAB CT/NG/T. vaginalis  3. Vaginal odor - WET PREP FOR TRICH, YEAST, CLUE  4. Bacterial vaginitis - metroNIDAZOLE (FLAGYL) 500 MG tablet; Take 1 tablet (500 mg total) by mouth 2 (two) times daily.  Dispense: 14 tablet; Refill: 0

## 2022-05-27 ENCOUNTER — Ambulatory Visit: Payer: No Typology Code available for payment source | Admitting: Obstetrics and Gynecology

## 2022-05-27 ENCOUNTER — Encounter: Payer: Self-pay | Admitting: Obstetrics and Gynecology

## 2022-05-27 VITALS — BP 138/80 | HR 78 | Ht 64.0 in | Wt 281.0 lb

## 2022-05-27 DIAGNOSIS — Z113 Encounter for screening for infections with a predominantly sexual mode of transmission: Secondary | ICD-10-CM | POA: Diagnosis not present

## 2022-05-27 DIAGNOSIS — Z8619 Personal history of other infectious and parasitic diseases: Secondary | ICD-10-CM | POA: Diagnosis not present

## 2022-05-27 DIAGNOSIS — N898 Other specified noninflammatory disorders of vagina: Secondary | ICD-10-CM

## 2022-05-27 DIAGNOSIS — B9689 Other specified bacterial agents as the cause of diseases classified elsewhere: Secondary | ICD-10-CM

## 2022-05-27 DIAGNOSIS — N760 Acute vaginitis: Secondary | ICD-10-CM

## 2022-05-27 LAB — WET PREP FOR TRICH, YEAST, CLUE

## 2022-05-27 MED ORDER — METRONIDAZOLE 500 MG PO TABS: 500.0000 mg | ORAL_TABLET | Freq: Two times a day (BID) | ORAL | 0 refills | Status: DC

## 2022-05-27 NOTE — Patient Instructions (Addendum)
Bacterial Vaginosis  Bacterial vaginosis is an infection that occurs when the normal balance of bacteria in the vagina changes. This change is caused by an overgrowth of certain bacteria in the vagina. Bacterial vaginosis is the most common vaginal infection among females aged 34 to 44 years. This condition increases the risk of sexually transmitted infections (STIs). Treatment can help reduce this risk. Treatment is very important for pregnant women because this condition can cause babies to be born early (prematurely) or at a low birth weight. What are the causes? This condition is caused by an increase in harmful bacteria that are normally present in small amounts in the vagina. However, the exact reason this condition develops is not known. You cannot get bacterial vaginosis from toilet seats, bedding, swimming pools, or contact with objects around you. What increases the risk? The following factors may make you more likely to develop this condition: Having a new sexual partner or multiple sexual partners, or having unprotected sex. Douching. Having an intrauterine device (IUD). Smoking. Abusing drugs and alcohol. This may lead to riskier sexual behavior. Taking certain antibiotic medicines. Being pregnant. What are the signs or symptoms? Some women with this condition have no symptoms. Symptoms may include: Gray or white vaginal discharge. The discharge can be watery or foamy. A fish-like odor with discharge, especially after sex or during menstruation. Itching in and around the vagina. Burning or pain with urination. How is this diagnosed? This condition is diagnosed based on: Your medical history. A physical exam of the vagina. Checking a sample of vaginal fluid for harmful bacteria or abnormal cells. How is this treated? This condition is treated with antibiotic medicines. These may be given as a pill, a vaginal cream, or a medicine that is put into the vagina (suppository). If  the condition comes back after treatment, a second round of antibiotics may be needed. Follow these instructions at home: Medicines Take or apply over-the-counter and prescription medicines only as told by your health care provider. Take or apply your antibiotic medicine as told by your health care provider. Do not stop using the antibiotic even if you start to feel better. General instructions If you have a female sexual partner, tell her that you have a vaginal infection. She should follow up with her health care provider. If you have a female sexual partner, he does not need treatment. Avoid sexual activity until you finish treatment. Drink enough fluid to keep your urine pale yellow. Keep the area around your vagina and rectum clean. Wash the area daily with warm water. Wipe yourself from front to back after using the toilet. If you are breastfeeding, talk to your health care provider about continuing breastfeeding during treatment. Keep all follow-up visits. This is important. How is this prevented? Self-care Do not douche. Wash the outside of your vagina with warm water only. Wear cotton or cotton-lined underwear. Avoid wearing tight pants and pantyhose, especially during the summer. Safe sex Use protection when having sex. This includes: Using condoms. Using dental dams. This is a thin layer of a material made of latex or polyurethane that protects the mouth during oral sex. Limit the number of sexual partners. To help prevent bacterial vaginosis, it is best to have sex with just one partner (monogamous relationship). Make sure you and your sexual partner are tested for STIs. Drugs and alcohol Do not use any products that contain nicotine or tobacco. These products include cigarettes, chewing tobacco, and vaping devices, such as e-cigarettes. If you need help   quitting, ask your health care provider. Do not use drugs. Do not drink alcohol if: Your health care provider tells you not  to do this. You are pregnant, may be pregnant, or are planning to become pregnant. If you drink alcohol: Limit how much you have to 0-1 drink a day. Be aware of how much alcohol is in your drink. In the [place] one drink equals one 12 oz bottle of beer (355 mL), one 5 oz glass of wine (148 mL), or one 1 oz glass of hard liquor (44 mL). Where to find more information Centers for Disease Control and Prevention: FootballExhibition.com.br American Sexual Health Association (ASHA): www.ashastd.org [place]. Department of Health and Health and safety inspector, Office on Women's Health: http://hoffman.com/ Contact a health care provider if: Your symptoms do not improve, even after treatment. You have more discharge or pain when urinating. You have a fever or chills. You have pain in your abdomen or pelvis. You have pain during sex. You have vaginal bleeding between menstrual periods. Summary Bacterial vaginosis is a vaginal infection that occurs when the normal balance of bacteria in the vagina changes. It results from an overgrowth of certain bacteria. This condition increases the risk of sexually transmitted infections (STIs). Getting treated can help reduce this risk. Treatment is very important for pregnant women because this condition can cause babies to be born early (prematurely) or at low birth weight. This condition is treated with antibiotic medicines. These may be given as a pill, a vaginal cream, or a medicine that is put into the vagina (suppository). This information is not intended to replace advice given to you by your health care provider. Make sure you discuss any questions you have with your health care provider. Document Revised: 12/14/2019 Document Reviewed: 12/14/2019 Elsevier Patient Education  2023 [employer] Inc. Chlamydia, Female  Chlamydia is a sexually transmitted infection (STI). This infection spreads through sexual contact. Chlamydia can occur in different areas of the body, including: The  urethra. This is the part of the body that drains urine from the bladder. The cervix. This is the lowest part of the uterus. The throat. The rectum. This condition is not difficult to treat. However, if left untreated, chlamydia can lead to more serious health problems, including pelvic inflammatory disease (PID). PID can increase your risk of being unable to have children. In pregnant women, untreated chlamydia can cause serious complications during pregnancy or health problems for the baby after delivery. What are the causes? This condition is caused by a bacteria called Chlamydia trachomatis. The bacteria are spread from an infected partner during sexual activity. Chlamydia can spread through contact with the genitals, mouth, or rectum. What increases the risk? The following factors may make you more likely to develop this condition: Not using a condom the right way or not using a condom every time you have sex. Having a new sex partner or having more than one sex partner. Being sexually active before age 34. What are the signs or symptoms? In some cases, there are no symptoms, especially early in the infection. If symptoms develop, they may include: Urinating often, or a burning feeling during urination. Redness, soreness, or swelling of the vagina or rectum. Discharge coming from the vagina or rectum. Pain in the abdomen. Pain during sex. Bleeding between menstrual periods or irregular periods. How is this diagnosed? This condition may be diagnosed with: Urine tests. Swab tests. Depending on your symptoms, your health care provider may use a cotton swab to collect a fluid sample from  your vagina, rectum, nose, or throat to test for the bacteria. A pelvic exam. How is this treated? This condition is treated with antibiotic medicines. Follow these instructions at home: Sexual activity Tell your sex partner or partners about your infection. These include any partners for oral, anal,  or vaginal sex that you have had within 60 days of when your symptoms started. Sex partners should also be treated, even if they have no signs of the infection. Do not have sex until you and your sex partners have completed treatment and your health care provider says it is okay. If your health care provider prescribed you a single-dose medicine as treatment, wait at least 7 days after taking the medicine before having sex. General instructions Take over-the-counter and prescription medicines as told by your health care provider. Finish all antibiotic medicine even when you start to feel better. It is up to you to get your test results. Ask your health care provider, or the department that is doing the test, when your results will be ready. Keep all follow-up visits. This is important. You may need to be tested for infection again 3 months after treatment. How is this prevented? You can lower your risk of getting chlamydia by: Using latex or polyurethane condoms correctly every time you have sex. Not having multiple sex partners. Asking if your sex partner has been tested for STIs and had negative results. Getting regular health screenings to check for STIs. Contact a health care provider if: You develop new symptoms or your symptoms are getting worse. Your symptoms do not get better after treatment. You have a fever or chills. You have pain during sex. You have irregular menstrual periods, or you have bleeding between periods or after sex. You develop flu-like symptoms, such as night sweats, sore throat, or muscle aches. You are unable to take your antibiotic medicine as prescribed. Summary Chlamydia is a sexually transmitted infection (STI) that is caused by bacteria. This infection spreads through sexual contact. This condition is treated with antibiotic medicines. If left untreated, chlamydia can lead to more serious health problems, including pelvic inflammatory disease (PID). Your sex  partners will also need to be treated. Do not have sex until both you and your partner have been treated. Take medicines as directed by your health care provider and keep all follow-up visits to ensure your infection has been completely treated. This information is not intended to replace advice given to you by your health care provider. Make sure you discuss any questions you have with your health care provider. Document Revised: 04/07/2021 Document Reviewed: 04/07/2021 Elsevier Patient Education  2023 [employer].

## 2022-05-28 LAB — SURESWAB CT/NG/T. VAGINALIS
C. trachomatis RNA, TMA: NOT DETECTED
N. gonorrhoeae RNA, TMA: NOT DETECTED
Trichomonas vaginalis RNA: NOT DETECTED

## 2022-07-25 ENCOUNTER — Other Ambulatory Visit: Payer: Self-pay | Admitting: Physician Assistant

## 2022-11-09 ENCOUNTER — Other Ambulatory Visit: Payer: Self-pay | Admitting: Physician Assistant

## 2023-02-21 ENCOUNTER — Other Ambulatory Visit: Payer: Self-pay | Admitting: Physician Assistant

## 2023-03-02 ENCOUNTER — Encounter: Payer: Self-pay | Admitting: Physician Assistant

## 2023-03-02 ENCOUNTER — Ambulatory Visit: Payer: No Typology Code available for payment source | Admitting: Obstetrics and Gynecology

## 2023-03-02 ENCOUNTER — Ambulatory Visit
Admission: RE | Admit: 2023-03-02 | Discharge: 2023-03-02 | Disposition: A | Discharge status: Home / Self Care | Payer: Commercial Managed Care - HMO | Source: Ambulatory Visit | Attending: Physician Assistant | Admitting: Physician Assistant

## 2023-03-02 ENCOUNTER — Other Ambulatory Visit: Payer: Self-pay

## 2023-03-02 VITALS — BP 184/125 | HR 88 | Temp 98.2°F | Resp 16

## 2023-03-02 DIAGNOSIS — I160 Hypertensive urgency: Secondary | ICD-10-CM | POA: Diagnosis not present

## 2023-03-02 NOTE — ED Triage Notes (Signed)
C/o hypertension and headache

## 2023-03-02 NOTE — Discharge Instructions (Addendum)
Advised patient to go to [facility] ED NOW for further evaluate patient of blood pressure currently 184/125

## 2023-03-02 NOTE — Telephone Encounter (Signed)
Left message on voicemail to call office and also send My Chart message.

## 2023-03-02 NOTE — Progress Notes (Signed)
Brandi Navarro is a 37 y.o. female here for a new problem.  History of Present Illness:   Chief Complaint  Patient presents with   Follow-up    Pt was seen at Urgent Care yesterday in [place] due to High Blood Pressure and then was sent to ED in [place].  BP today is 179/134, 164/111 at 10:54 when she got to work. Also c/o headache, Ibuprofen 600 mg some relief. Has some chest discomfort when she takes a deep breath, EKG done yesterday.    HPI  Hypertension  Started feeling strange on 02/26/23 after noting elevated blood pressure for multiple measurements. She went to urgent care and was told after triage to go to the ER due to elevated blood pressure and headache(s) She then went to [facility] Emergency Department at [facility] for hypertensive episode -- she had an EKG, urinalysis, troponin, CBC, CMP were normal.  Taking losartan-hydrochlorothiazide 50-12.5 mg daily. She has been taking consistently but did have to miss a few doses last week due to running out of medication.  Denies: changes in lifestyle. She had a series of panic attacks a few months ago but this has resolved. Denies shortness of breath. Has some central chest pain when taking a deep breath that she describes as a pressure.  Denies recent illness. She flew to [place] in 8/24; she has been having some feet/ankle swelling. Does not have calf pain/swelling/tenderness to palpation.  She is due for an eye exam. Denies double vision, loss of peripheral vision, or significant vision changes. No known head trauma. Denies slurred speech.  Reports having orthostatic lightheadedness. This has been happening prior to start of blood pressure issues. She drinks soda occasionally. Denies GI symptoms. Blood pressure elevated in the office at 160/114. Blood pressures measured at home are as follows: 164/111 this morning. Reports recording multiple pulse measurements exceeding 100 recently.  Headaches She has had  a headache for several days that has not changed in intensity. Rates pain at 4/10. Taking 3-4 ibuprofen tablets without relief. This is not her worst headache of her life. She was on trokendi in the past but did not like the way that it made her feel  Weight Loss She is interested in a weight loss medication.  Past Medical History:  Diagnosis Date   Asthma    well controlled; never on scheduled inhalers   HSV2    Hypertension    Migraines with aura      Social History   Tobacco Use   Smoking status: Former    Types: E-cigarettes   Smokeless tobacco: Never  Vaping Use   Vaping status: Never Used  Substance Use Topics   Alcohol use: Yes    Alcohol/week: 2.0 - 3.0 standard drinks of alcohol    Types: 2 - 3 Standard drinks or equivalent per week   Drug use: Yes    Types: Marijuana    Comment: daily    Past Surgical History:  Procedure Laterality Date   TONSILLECTOMY      Family History  Problem Relation Age of Onset   Anemia Mother    Hypertension Father    Heart attack Father    Anemia Sister    Anemia Maternal Grandmother    Diabetes Maternal Grandfather    Hypertension Maternal Grandfather    Prostate cancer Other    Thyroid cancer Paternal Aunt     No Known Allergies  Current Medications:   Current Outpatient Medications:    citalopram (CELEXA) 20 MG tablet, TAKE  1 TABLET BY MOUTH ONCE A DAY,   avoid Ibuprofen with this medication, may cause GI bleeding, Disp: 90 tablet, Rfl: 4   IBUPROFEN PO, Take by mouth., Disp: , Rfl:    loratadine (CLARITIN) 10 MG tablet, Take 10 mg by mouth daily., Disp: , Rfl:    LORazepam (ATIVAN) 0.5 MG tablet, Take 1 tablet (0.5 mg total) by mouth at bedtime. (Patient taking differently: Take 0.5 mg by mouth at bedtime as needed.), Disp: 30 tablet, Rfl: 0   losartan-hydrochlorothiazide (HYZAAR) 50-12.5 MG tablet, Take 1 tablet by mouth once daily, Disp: 90 tablet, Rfl: 0   Multiple Vitamins-Minerals (MULTIVITAMIN PO), Take by  mouth daily., Disp: , Rfl:    valACYclovir (VALTREX) 500 MG tablet, One daily for suppression, increase to two daily at outbreak for 3 days (Patient taking differently: Take 500 mg by mouth daily. One daily for suppression, increase to two daily at outbreak for 3 days), Disp: 90 tablet, Rfl: 4   Review of Systems:   Review of Systems  Constitutional:  Negative for fever and malaise/fatigue.  HENT:  Negative for congestion.   Eyes:  Negative for blurred vision.  Respiratory:  Negative for cough and shortness of breath.   Cardiovascular:  Positive for chest pain and leg swelling. Negative for palpitations.  Gastrointestinal:  Negative for vomiting.  Musculoskeletal:  Negative for back pain.  Skin:  Negative for rash.  Neurological:  Positive for dizziness (Orthostatic) and headaches. Negative for loss of consciousness.    Vitals:   Vitals:   03/03/23 1328  BP: (!) 160/114  Pulse: 81  Temp: (!) 97.5 F (36.4 C)  TempSrc: Temporal  SpO2: 97%  Weight: 295 lb (133.8 kg)  Height: 5\' 4"  (1.626 m)     Body mass index is 50.64 kg/m.  Physical Exam:   Physical Exam Vitals and nursing note reviewed.  Constitutional:      General: She is not in acute distress.    Appearance: She is well-developed. She is not ill-appearing or toxic-appearing.  Cardiovascular:     Rate and Rhythm: Normal rate and regular rhythm.     Pulses: Normal pulses.     Heart sounds: Normal heart sounds, S1 normal and S2 normal.  Pulmonary:     Effort: Pulmonary effort is normal.     Breath sounds: Normal breath sounds.  Skin:    General: Skin is warm and dry.  Neurological:     General: No focal deficit present.     Mental Status: She is alert.     GCS: GCS eye subscore is 4. GCS verbal subscore is 5. GCS motor subscore is 6.     Cranial Nerves: Cranial nerves 2-12 are intact.     Sensory: Sensation is intact.     Motor: Motor function is intact.     Coordination: Coordination is intact.     Gait:  Gait is intact.  Psychiatric:        Speech: Speech normal.        Behavior: Behavior normal. Behavior is cooperative.     Assessment and Plan:   Essential hypertension Above goal today No evidence of end-organ damage on my exam Recommend patient monitor home blood pressure daily Increase  losartan-hydrochlorothiazide to 100 - 25 mg daily. If home monitoring shows consistent elevation, or any symptom(s) develop, recommend reach out to [name] for further advice on next steps Update blood work today to rule out organic cause of symptom(s)  Follow-up in 2-4 weeks for blood  pressure recheck  Migraine with aura and without status migrainosus, not intractable; Daily headache No red flags; neurology exam is wnl Headache(s) is consistent with tension-type headache(s); she does admit to taking ibuprofen 2-4 tablets pretty much daily - may be having some rebound headache(s) Provided ubrelvy 50 mg sample for her to take to see if this helps now or with future migraines If any worsening of headache or any change, recommend ER   Morbid obesity (HCC) She is interested in GLP-1 She would be a good candidate for this potentially -- I have asked her to call her insurance company to see if they cover these medications  I,Alexander Ruley,acting as a Neurosurgeon for [employer], PA.,have documented all relevant documentation on the behalf of Jarold Motto, PA,as directed by  Jarold Motto, PA while in the presence of Jarold Motto, [place].  I, Jarold Motto, [place], have reviewed all documentation for this visit. The documentation on 03/03/23 for the exam, diagnosis, procedures, and orders are all accurate and complete.  Jarold Motto, PA-C

## 2023-03-02 NOTE — ED Notes (Signed)
Patient is being discharged from the Urgent Care and sent to the Emergency Department via pov . Per ragan np, patient is in need of higher level of care due to severity. Patient is aware and verbalizes understanding of plan of care.  Vitals:   03/02/23 1703 03/02/23 1710  BP: (!) 184/125 (!) 184/125  Pulse: 88   Resp: 16   Temp: 98.2 F (36.8 C)   SpO2: 98%

## 2023-03-02 NOTE — ED Provider Notes (Signed)
[facility]    CSN: [id number] Arrival date & time: 03/02/23  1639      History   Chief Complaint Chief Complaint  Patient presents with   Hypertension    I currently am on Losartan but my blood pressure and pulse has been running high. Last reading was 165/116 BP 95 I have an appointment with my PCP tomorrow but they recommended since I have had a bad headache with it for days to find an urgent care. - Entered by patient    HPI Zonia Horwedel is a 37 y.o. female.   HPI 28-year-old female presents with hypertension and headache for 3 days.  Patient reports has been running high blood pressures with most recent of 165/116.  Manual blood pressure in triage was 185/125.  PMH significant for morbid obesity, HTN, and asthma.  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   Migraine with aura and without status migrainosus, not intractable 12/19/2017   PMDD (premenstrual dysphoric disorder) 12/19/2017   Seasonal allergic rhinitis due to pollen 12/19/2017   Essential hypertension 12/19/2017   Marijuana user 12/19/2017   Pure hypercholesterolemia 12/19/2017    Past Surgical History:  Procedure Laterality Date   TONSILLECTOMY      OB History     Gravida  1   Para  0   Term  0   Preterm  0   AB  1   Living  0      SAB  0   IAB  1   Ectopic  0   Multiple  0   Live Births               Home Medications    Prior to Admission medications   Medication Sig Start Date End Date Taking? Authorizing Provider  citalopram (CELEXA) 20 MG tablet TAKE 1 TABLET BY MOUTH ONCE A DAY,   avoid Ibuprofen with this medication, may cause GI bleeding 02/24/22   Romualdo Bolk, MD   IBUPROFEN PO Take by mouth.    [provider]  loratadine (CLARITIN) 10 MG tablet Take 10 mg by mouth daily.    [provider]  LORazepam (ATIVAN) 0.5 MG tablet Take 1 tablet (0.5 mg total) by mouth at bedtime. 04/08/20   Jarold Motto, PA  losartan-hydrochlorothiazide [facility]) 50-12.5 MG tablet Take 1 tablet by mouth once daily 02/22/23   Jarold Motto, PA  metroNIDAZOLE (FLAGYL) 500 MG tablet Take 1 tablet (500 mg total) by mouth 2 (two) times daily. 05/27/22   Romualdo Bolk, MD  Multiple Vitamins-Minerals (MULTIVITAMIN PO) Take by mouth daily.    [provider]  phentermine (ADIPEX-P) 37.5 MG tablet Take 1 tablet (37.5 mg total) by mouth daily before breakfast. 09/26/21   Jarold Motto, PA  valACYclovir (VALTREX) 500 MG tablet One daily for suppression, increase to two daily at outbreak for 3 days 02/24/22   Romualdo Bolk, MD    Family History Family History  Problem Relation Age of Onset   Anemia Mother    Hypertension Father    Heart attack Father    Anemia Sister    Anemia Maternal Grandmother  Diabetes Maternal Grandfather    Hypertension Maternal Grandfather    Prostate cancer Other    Thyroid cancer Paternal Aunt     Social History Social History   Tobacco Use   Smoking status: Former    Types: E-cigarettes   Smokeless tobacco: Never  Vaping Use   Vaping status: Never Used  Substance Use Topics   Alcohol use: Yes    Alcohol/week: 2.0 - 3.0 standard drinks of alcohol    Types: 2 - 3 Standard drinks or equivalent per week   Drug use: Yes    Types: Marijuana    Comment: daily     Allergies   Patient has no known allergies.   Review of Systems Review of Systems  Cardiovascular:        Elevated blood pressure with headache for 3 days  All other systems reviewed and are negative.    Physical Exam Triage Vital Signs ED Triage Vitals  Encounter Vitals Group     BP 03/02/23 1703 (!) 184/125     Systolic  BP Percentile --      Diastolic BP Percentile --      Pulse Rate 03/02/23 1703 88     Resp 03/02/23 1703 16     Temp 03/02/23 1703 98.2 F (36.8 C)     Temp Source 03/02/23 1703 Oral     SpO2 03/02/23 1703 98 %     Weight --      Height --      Head Circumference --      Peak Flow --      Pain Score 03/02/23 1706 6     Pain Loc --      Pain Education --      Exclude from Growth Chart --    No data found.  Updated Vital Signs BP (!) 184/125 (BP Location: Right Arm)   Pulse 88   Temp 98.2 F (36.8 C) (Oral)   Resp 16   SpO2 98%     Physical Exam Vitals and nursing note reviewed.  Constitutional:      General: She is not in acute distress.    Appearance: Normal appearance. She is obese. She is not ill-appearing.  HENT:     Head: Normocephalic and atraumatic.     Mouth/Throat:     Mouth: Mucous membranes are moist.     Pharynx: Oropharynx is clear.  Eyes:     Extraocular Movements: Extraocular movements intact.     Conjunctiva/sclera: Conjunctivae normal.     Pupils: Pupils are equal, round, and reactive to light.  Neck:     Vascular: No carotid bruit.     Comments: No JVD, no bruit Cardiovascular:     Rate and Rhythm: Normal rate.     Pulses: Normal pulses.     Heart sounds: Normal heart sounds. No murmur heard.    No friction rub. No gallop.  Pulmonary:     Effort: Pulmonary effort is normal.     Breath sounds: Normal breath sounds. No wheezing or rales.  Musculoskeletal:     Cervical back: Normal range of motion and neck supple.  Neurological:     Mental Status: She is alert.      UC Treatments / Results  Labs (all labs ordered are listed, but only abnormal results are displayed) Labs Reviewed - No data to display  EKG   Radiology No results found.  Procedures Procedures (including critical care time)  Medications Ordered in UC Medications - No  data to display  Initial Impression / Assessment and Plan / UC Course  I have reviewed the  triage vital signs and the nursing notes.  Pertinent labs & imaging results that were available during my care of the patient were reviewed by me and considered in my medical decision making (see chart for details).     MDM 1.  Hypertensive urgency-instructed patient to go to [facility] ED NOW for further evaluation of blood pressure.  Patient advised blood pressure is currently 184/125.  Patient agreed and verbalized understanding of these instructions and this plan of care today.  Final Clinical Impressions(s) / UC Diagnoses   Final diagnoses:  Hypertensive urgency     Discharge Instructions      Advised patient to go to [facility] ED NOW for further evaluate patient of blood pressure currently 184/125    ED Prescriptions   None    PDMP not reviewed this encounter.   Trevor Iha, FNP 03/02/23 1724

## 2023-03-02 NOTE — ED Notes (Signed)
Bp 184/125, np ragan notified

## 2023-03-02 NOTE — Telephone Encounter (Signed)
Please see message and advise. Pt is scheduled tomorrow.

## 2023-03-03 ENCOUNTER — Ambulatory Visit: Payer: Commercial Managed Care - HMO | Admitting: Physician Assistant

## 2023-03-03 VITALS — BP 163/108 | HR 81 | Temp 97.5°F | Ht 64.0 in | Wt 295.0 lb

## 2023-03-03 DIAGNOSIS — R519 Headache, unspecified: Secondary | ICD-10-CM

## 2023-03-03 DIAGNOSIS — I10 Essential (primary) hypertension: Secondary | ICD-10-CM

## 2023-03-03 DIAGNOSIS — G43109 Migraine with aura, not intractable, without status migrainosus: Secondary | ICD-10-CM

## 2023-03-03 LAB — HEMOGLOBIN A1C: Hgb A1c MFr Bld: 5.4 % (ref 4.6–6.5)

## 2023-03-03 LAB — COMPREHENSIVE METABOLIC PANEL
ALT: 47 U/L — ABNORMAL HIGH (ref 0–35)
AST: 32 U/L (ref 0–37)
Albumin: 4.2 g/dL (ref 3.5–5.2)
Alkaline Phosphatase: 112 U/L (ref 39–117)
BUN: 13 mg/dL (ref 6–23)
CO2: 29 meq/L (ref 19–32)
Calcium: 9.8 mg/dL (ref 8.4–10.5)
Chloride: 101 meq/L (ref 96–112)
Creatinine, Ser: 0.87 mg/dL (ref 0.40–1.20)
GFR: 86.42 mL/min (ref >60.00)
Glucose, Bld: 85 mg/dL (ref 70–99)
Potassium: 3.9 meq/L (ref 3.5–5.1)
Sodium: 138 meq/L (ref 135–145)
Total Bilirubin: 0.3 mg/dL (ref 0.2–1.2)
Total Protein: 7.4 g/dL (ref 6.0–8.3)

## 2023-03-03 LAB — CBC WITH DIFFERENTIAL/PLATELET
Basophils Absolute: 0 10*3/uL (ref 0.0–0.1)
Basophils Relative: 0.6 % (ref 0.0–3.0)
Eosinophils Absolute: 0 10*3/uL (ref 0.0–0.7)
Eosinophils Relative: 0.5 % (ref 0.0–5.0)
HCT: 42 % (ref 36.0–46.0)
Hemoglobin: 13.5 g/dL (ref 12.0–15.0)
Lymphocytes Relative: 25 % (ref 12.0–46.0)
Lymphs Abs: 1.8 10*3/uL (ref 0.7–4.0)
MCHC: 32.1 g/dL (ref 30.0–36.0)
MCV: 95.6 fl (ref 78.0–100.0)
Monocytes Absolute: 0.6 10*3/uL (ref 0.1–1.0)
Monocytes Relative: 9.1 % (ref 3.0–12.0)
Neutro Abs: 4.6 10*3/uL (ref 1.4–7.7)
Neutrophils Relative %: 64.8 % (ref 43.0–77.0)
Platelets: 299 10*3/uL (ref 150.0–400.0)
RBC: 4.39 Mil/uL (ref 3.87–5.11)
RDW: 13.8 % (ref 11.5–15.5)
WBC: 7.1 10*3/uL (ref 4.0–10.5)

## 2023-03-03 LAB — LIPID PANEL
Cholesterol: 237 mg/dL — ABNORMAL HIGH (ref 0–200)
HDL: 38.2 mg/dL — ABNORMAL LOW (ref >39.00)
LDL Cholesterol: 133 mg/dL — ABNORMAL HIGH (ref 0–99)
NonHDL: 198.49
Total CHOL/HDL Ratio: 6
Triglycerides: 329 mg/dL — ABNORMAL HIGH (ref 0.0–149.0)
VLDL: 65.8 mg/dL — ABNORMAL HIGH (ref 0.0–40.0)

## 2023-03-03 LAB — TSH: TSH: 3.7 u[IU]/mL (ref 0.35–5.50)

## 2023-03-03 MED ORDER — LOSARTAN POTASSIUM-HCTZ 100-25 MG PO TABS: 1.0000 | ORAL_TABLET | Freq: Every day | ORAL | 1 refills | Status: DC

## 2023-03-03 NOTE — Patient Instructions (Signed)
It was great to see you!  Double up your blood pressure medication - start Losartan 100- hydrochlorothiazide 25 mg tomorrow  Check to see if your insurance covers Wegovy or Zepbound  If chest pain, shortness of breath, or headache(s) worsen -- go back to the emergency room   Take care,  Jarold Motto PA-C

## 2023-03-04 LAB — D-DIMER, QUANTITATIVE: D-Dimer, Quant: 0.24 ug{FEU}/mL (ref <0.50)

## 2023-03-26 ENCOUNTER — Encounter: Payer: Self-pay | Admitting: Obstetrics and Gynecology

## 2023-03-26 DIAGNOSIS — Z8619 Personal history of other infectious and parasitic diseases: Secondary | ICD-10-CM

## 2023-03-29 MED ORDER — VALACYCLOVIR HCL 500 MG PO TABS
ORAL_TABLET | ORAL | 0 refills | Status: DC
Start: 2023-03-29 — End: 2023-06-07

## 2023-03-29 NOTE — Telephone Encounter (Signed)
Med refill request: Valacyclovir 500 mg tab PO daily, increase to 2 daily with outbreak for 3 days Last AEX: 02/24/22 -JJ Next AEX: 06/07/23 -BS  Hx HSV.   Refill authorized: Please Advise?  Routing to covering provider to review request.

## 2023-05-24 NOTE — Progress Notes (Unsigned)
37 y.o. G105P0010 Single Caucasian female here for annual exam.    Followed for depression and anxiety and HSV. Celexa is a mood stabilizer for her and is working well overall.  She has considered getting a new counselor.  Her periods are now every 40 days.  No bleeding in between her cycles.  Has long hx of cramping.  Heating pad and ibuprofen are helpful.   She tried a [place] IUD and had a lot of pain, so it was removed.  She tried POP, and had irregular cycles and increased acne.   She has headaches most days.   She forgot her antiHTN med today.   Wants testing for STD and for vaginitis.  Hx BV.   Works for [employer].  Has 2 dogs, a third dog passed away.   PCP: Jarold Motto, PA   Patient's last menstrual period was 05/15/2023.     Period Cycle (Days): 40 Period Duration (Days): 5-7 Period Pattern: (!) Irregular Menstrual Flow: Heavy Menstrual Control: Tampon Dysmenorrhea: (!) Severe     Sexually active: Yes.    The current method of family planning is condoms.    Menopausal hormone therapy:  n/a Exercising: No.   Smoker:  former  OB History  Gravida Para Term Preterm AB Living  1 0 0 0 1 0  SAB IAB Ectopic Multiple Live Births  0 1 0 0      # Outcome Date GA Lbr Len/2nd Weight Sex Type Anes PTL Lv  1 IAB              HEALTH MAINTENANCE: Last 2 paps:  02/14/20 neg, 01/11/19 neg: HR HPV neg History of abnormal Pap or positive HPV:  no Mammogram:   n/a Colonoscopy:  n/a Bone Density:  n/a  Result  n/a   Immunization History  Administered Date(s) Administered   HPV Quadrivalent 11/17/2011, 01/18/2012, 05/20/2012   Tdap 06/29/2005, 12/10/2016      reports that she has quit smoking. Her smoking use included e-cigarettes. She has never used smokeless tobacco. She reports current alcohol use of about 2.0 - 3.0 standard drinks of alcohol per week. She reports current drug use. Drug: Marijuana.  Past Medical History:  Diagnosis Date   Asthma     well controlled; never on scheduled inhalers   HSV2    Hypertension    Migraines with aura     Past Surgical History:  Procedure Laterality Date   TONSILLECTOMY      Current Outpatient Medications  Medication Sig Dispense Refill   citalopram (CELEXA) 20 MG tablet TAKE 1 TABLET BY MOUTH ONCE A DAY,   avoid Ibuprofen with this medication, may cause GI bleeding 90 tablet 4   IBUPROFEN PO Take by mouth.     loratadine (CLARITIN) 10 MG tablet Take 10 mg by mouth daily.     LORazepam (ATIVAN) 0.5 MG tablet Take 1 tablet (0.5 mg total) by mouth at bedtime. (Patient taking differently: Take 0.5 mg by mouth at bedtime as needed.) 30 tablet 0   losartan-hydrochlorothiazide (HYZAAR) 100-25 MG tablet Take 1 tablet by mouth daily. 30 tablet 1   Multiple Vitamins-Minerals (MULTIVITAMIN PO) Take by mouth daily.     valACYclovir (VALTREX) 500 MG tablet One daily for suppression, increase to two daily at outbreak for 3 days 90 tablet 0   No current facility-administered medications for this visit.    ALLERGIES: Patient has no known allergies.  Family History  Problem Relation Age of Onset  Anemia Mother    Hypertension Father    Heart attack Father    Anemia Sister    Anemia Maternal Grandmother    Diabetes Maternal Grandfather    Hypertension Maternal Grandfather    Prostate cancer Other    Thyroid cancer Paternal Aunt     Review of Systems  All other systems reviewed and are negative.   PHYSICAL EXAM:  BP (!) 148/92 (BP Location: Left Arm, Patient Position: Sitting, Cuff Size: Large)   Ht 5\' 4"  (1.626 m)   Wt 295 lb (133.8 kg)   LMP 05/15/2023   BMI 50.64 kg/m     General appearance: alert, cooperative and appears stated age Head: normocephalic, without obvious abnormality, atraumatic Neck: no adenopathy, supple, symmetrical, trachea midline and thyroid normal to inspection and palpation Lungs: clear to auscultation bilaterally Breasts: normal appearance, no masses or  tenderness, No nipple retraction or dimpling, No nipple discharge or bleeding, No axillary adenopathy Heart: regular rate and rhythm Abdomen: soft, non-tender; no masses, no organomegaly Extremities: extremities normal, atraumatic, no cyanosis or edema Skin: skin color, texture, turgor normal. No rashes or lesions Lymph nodes: cervical, supraclavicular, and axillary nodes normal. Neurologic: grossly normal  Pelvic: External genitalia:  no lesions              No abnormal inguinal nodes palpated.              Urethra:  normal appearing urethra with no masses, tenderness or lesions              Bartholins and Skenes: normal                 Vagina: normal appearing vagina with normal color and discharge, no lesions              Cervix: no lesions              Pap taken: No. Bimanual Exam:  Uterus:  normal size, contour, position, consistency, mobility, non-tender              Adnexa: no mass, fullness, tenderness    Chaperone was present for exam:  Warren Lacy, CMA  ASSESSMENT: Well woman visit with gynecologic exam HTN.  Migraine with aura.  HSV 2.  Hx chlamydia.  ***  PLAN: Mammogram screening discussed. Self breast awareness reviewed. Pap and HRV collected:  No.  Pap and HR HPV in 2025 Guidelines for Calcium, Vitamin D, regular exercise program including cardiovascular and weight bearing exercise. Medication refills:  Celexa and Valtrex refills.  Brochure on [employer].  STD testing and vaginitis testing.  Follow up:  1 year and prn.

## 2023-06-07 ENCOUNTER — Encounter: Payer: Self-pay | Admitting: Obstetrics and Gynecology

## 2023-06-07 ENCOUNTER — Ambulatory Visit (INDEPENDENT_AMBULATORY_CARE_PROVIDER_SITE_OTHER): Payer: Commercial Managed Care - HMO | Admitting: Obstetrics and Gynecology

## 2023-06-07 ENCOUNTER — Other Ambulatory Visit (HOSPITAL_COMMUNITY)
Admission: RE | Admit: 2023-06-07 | Discharge: 2023-06-07 | Disposition: A | Discharge status: Home / Self Care | Payer: Commercial Managed Care - HMO | Source: Ambulatory Visit | Attending: Obstetrics and Gynecology | Admitting: Obstetrics and Gynecology

## 2023-06-07 VITALS — BP 146/88 | Ht 64.0 in | Wt 295.0 lb

## 2023-06-07 DIAGNOSIS — F418 Other specified anxiety disorders: Secondary | ICD-10-CM

## 2023-06-07 DIAGNOSIS — B009 Herpesviral infection, unspecified: Secondary | ICD-10-CM

## 2023-06-07 DIAGNOSIS — N898 Other specified noninflammatory disorders of vagina: Secondary | ICD-10-CM | POA: Diagnosis present

## 2023-06-07 DIAGNOSIS — Z8619 Personal history of other infectious and parasitic diseases: Secondary | ICD-10-CM

## 2023-06-07 DIAGNOSIS — F331 Major depressive disorder, recurrent, moderate: Secondary | ICD-10-CM

## 2023-06-07 DIAGNOSIS — Z113 Encounter for screening for infections with a predominantly sexual mode of transmission: Secondary | ICD-10-CM | POA: Diagnosis present

## 2023-06-07 DIAGNOSIS — Z01419 Encounter for gynecological examination (general) (routine) without abnormal findings: Secondary | ICD-10-CM | POA: Diagnosis not present

## 2023-06-07 DIAGNOSIS — Z1159 Encounter for screening for other viral diseases: Secondary | ICD-10-CM

## 2023-06-07 DIAGNOSIS — N761 Subacute and chronic vaginitis: Secondary | ICD-10-CM

## 2023-06-07 DIAGNOSIS — Z114 Encounter for screening for human immunodeficiency virus [HIV]: Secondary | ICD-10-CM

## 2023-06-07 MED ORDER — CITALOPRAM HYDROBROMIDE 20 MG PO TABS: ORAL_TABLET | ORAL | 3 refills | Status: DC

## 2023-06-07 MED ORDER — VALACYCLOVIR HCL 500 MG PO TABS: ORAL_TABLET | ORAL | 3 refills | Status: DC

## 2023-06-07 NOTE — Patient Instructions (Signed)

## 2023-06-08 LAB — CERVICOVAGINAL ANCILLARY ONLY
Bacterial Vaginitis (gardnerella): POSITIVE — AB
Candida Glabrata: NEGATIVE
Candida Vaginitis: NEGATIVE
Chlamydia: NEGATIVE
Comment: NEGATIVE
Comment: NEGATIVE
Comment: NEGATIVE
Comment: NEGATIVE
Comment: NEGATIVE
Comment: NORMAL
Neisseria Gonorrhea: NEGATIVE
Trichomonas: NEGATIVE

## 2023-06-09 LAB — HIV ANTIBODY (ROUTINE TESTING W REFLEX): HIV 1&2 Ab, 4th Generation: NONREACTIVE

## 2023-06-09 LAB — RPR: RPR Ser Ql: NONREACTIVE

## 2023-06-09 LAB — HEPATITIS C ANTIBODY: Hepatitis C Ab: NONREACTIVE

## 2023-06-10 ENCOUNTER — Other Ambulatory Visit: Payer: Self-pay | Admitting: Obstetrics and Gynecology

## 2023-06-10 ENCOUNTER — Encounter: Payer: Self-pay | Admitting: Obstetrics and Gynecology

## 2023-06-10 MED ORDER — METRONIDAZOLE 0.75 % VA GEL: 1.0000 | Freq: Every day | VAGINAL | 0 refills | Status: DC

## 2023-06-10 NOTE — Progress Notes (Signed)
Rx for Metrogel to treat BV.

## 2023-06-16 ENCOUNTER — Encounter: Payer: Self-pay | Admitting: Obstetrics and Gynecology

## 2023-06-17 ENCOUNTER — Other Ambulatory Visit: Payer: Self-pay | Admitting: Obstetrics and Gynecology

## 2023-06-17 MED ORDER — METRONIDAZOLE 500 MG PO TABS: 500.0000 mg | ORAL_TABLET | Freq: Two times a day (BID) | ORAL | 0 refills | Status: DC

## 2023-06-17 NOTE — Progress Notes (Signed)
Rx switch from Metrogel to Flagyl.

## 2023-06-22 ENCOUNTER — Other Ambulatory Visit: Payer: Self-pay | Admitting: Physician Assistant

## 2023-09-16 ENCOUNTER — Other Ambulatory Visit: Payer: Self-pay | Admitting: Physician Assistant

## 2023-10-22 ENCOUNTER — Other Ambulatory Visit: Payer: Self-pay

## 2023-10-22 ENCOUNTER — Ambulatory Visit
Admission: RE | Admit: 2023-10-22 | Discharge: 2023-10-22 | Disposition: A | Discharge status: Home / Self Care | Source: Ambulatory Visit | Attending: Family Medicine | Admitting: Family Medicine

## 2023-10-22 VITALS — BP 149/87 | HR 84 | Temp 98.4°F | Resp 16

## 2023-10-22 DIAGNOSIS — H9201 Otalgia, right ear: Secondary | ICD-10-CM

## 2023-10-22 NOTE — ED Provider Notes (Signed)
 [facility]    CSN: [id number] Arrival date & time: 10/22/23  1635      History   Chief Complaint Chief Complaint  Patient presents with   Ear Fullness   Dental Pain    HPI Brandi Navarro is a 38 y.o. female.   Patient complains of right ear pain and right side upper dental pain for one week.  She visited her dentist today who determined that her pain did not appear to be a tooth problem, but referred her to an oral Careers adviser.  She would like to have ear infection ruled out.  She denies sinus congestion, facial pain, and fevers, chills, and sweats.  The history is provided by the patient.    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   Migraine with aura and without status migrainosus, not intractable 12/19/2017   PMDD (premenstrual dysphoric disorder) 12/19/2017   Seasonal allergic rhinitis due to pollen 12/19/2017   Essential hypertension 12/19/2017   Marijuana user 12/19/2017   Pure hypercholesterolemia 12/19/2017    Past Surgical History:  Procedure Laterality Date   TONSILLECTOMY      OB History     Gravida  1   Para  0   Term  0   Preterm  0   AB  1   Living  0      SAB  0   IAB  1   Ectopic  0   Multiple  0   Live Births               Home Medications    Prior to Admission medications   Medication Sig Start Date End Date Taking? Authorizing Provider  metroNIDAZOLE  (FLAGYL ) 500 MG tablet Take 1 tablet (500 mg total) by mouth 2 (two) times daily. Take twice a day for 1 week. 06/17/23   Greta Leatherwood, MD  citalopram  (CELEXA ) 20 MG tablet TAKE 1 TABLET BY MOUTH ONCE A DAY,   avoid Ibuprofen with this medication, may cause GI bleeding  06/07/23   Greta Leatherwood, MD  IBUPROFEN PO Take by mouth.    [provider]  loratadine (CLARITIN) 10 MG tablet Take 10 mg by mouth daily.    [provider]  LORazepam  (ATIVAN ) 0.5 MG tablet Take 1 tablet (0.5 mg total) by mouth at bedtime. Patient taking differently: Take 0.5 mg by mouth at bedtime as needed. 04/08/20   Alexander Iba, PA  losartan -hydrochlorothiazide (HYZAAR) 100-25 MG tablet Take 1 tablet by mouth once daily 09/16/23   Worley, Samantha, PA  Multiple Vitamins-Minerals (MULTIVITAMIN PO) Take by mouth daily.    [provider]  valACYclovir  (VALTREX ) 500 MG tablet One daily for suppression, increase to two daily at outbreak for 3 days 06/07/23   Greta Leatherwood, MD    Family History Family History  Problem Relation Age of Onset   Anemia Mother    Hypertension Father    Heart attack Father    Anemia Sister    Anemia Maternal Grandmother    Diabetes Maternal Grandfather    Hypertension Maternal Grandfather    Prostate cancer Other  Thyroid  cancer Paternal Aunt     Social History Social History   Tobacco Use   Smoking status: Former    Types: E-cigarettes   Smokeless tobacco: Never  Vaping Use   Vaping status: Never Used  Substance Use Topics   Alcohol use: Yes    Alcohol/week: 2.0 - 3.0 standard drinks of alcohol    Types: 2 - 3 Standard drinks or equivalent per week   Drug use: Yes    Types: Marijuana    Comment: daily     Allergies   Patient has no known allergies.   Review of Systems Review of Systems No sore throat No cough No pleuritic pain No wheezing No nasal congestion No post-nasal drainage No sinus pain/pressure ? toothache No itchy/red eyes ? earache No hemoptysis No SOB No fever/chills No nausea No vomiting No abdominal pain No diarrhea No urinary symptoms No skin rash No fatigue No myalgias No headache   Physical Exam Triage Vital Signs ED Triage Vitals  Encounter  Vitals Group     BP 10/22/23 1644 (!) 149/87     Systolic BP Percentile --      Diastolic BP Percentile --      Pulse Rate 10/22/23 1644 84     Resp 10/22/23 1644 16     Temp 10/22/23 1644 98.4 F (36.9 C)     Temp src --      SpO2 10/22/23 1644 98 %     Weight --      Height --      Head Circumference --      Peak Flow --      Pain Score 10/22/23 1648 6     Pain Loc --      Pain Education --      Exclude from Growth Chart --    No data found.  Updated Vital Signs BP (!) 149/87   Pulse 84   Temp 98.4 F (36.9 C)   Resp 16   LMP 10/22/2023 (Exact Date)   SpO2 98%   Visual Acuity Right Eye Distance:   Left Eye Distance:   Bilateral Distance:    Right Eye Near:   Left Eye Near:    Bilateral Near:     Physical Exam Nursing notes and Vital Signs reviewed. Appearance:  Patient appears stated age, and in no acute distress Eyes:  Pupils are equal, round, and reactive to light and accomodation.  Extraocular movement is intact.  Conjunctivae are not inflamed  Ears:  Canals normal.  Tympanic membranes normal.  Nose:  Normal turbinates.  No sinus tenderness. No TMJ tenderness.  Pharynx:  Normal Neck:  Supple. No adenopathy.   Lungs:  Clear to auscultation.  Breath sounds are equal.  Moving air well. Heart:  Regular rate and rhythm without murmurs, rubs, or gallops.  Abdomen:  Nontender.  Extremities:  No edema.  Skin:  No rash present.   UC Treatments / Results  Labs (all labs ordered are listed, but only abnormal results are displayed) Labs Reviewed - No data to display  EKG   Radiology No results found.  Procedures Procedures Tympanometry:  Right ear tympanogram slightly increased peak pressure; Left ear tympanogram normal.    Medications Ordered in UC Medications - No data to display  Initial Impression / Assessment and Plan / UC Course  I have reviewed the triage vital signs and the nursing notes.  Pertinent labs & imaging results that were  available during my care of the patient  were reviewed by me and considered in my medical decision making (see chart for details).    No evidence otitis media. ?eustachian tube dysfunction. Followup with oral surgeon as scheduled.  Final Clinical Impressions(s) / UC Diagnoses   Final diagnoses:  Otalgia of right ear     Discharge Instructions      No evidence of ear infection today.   Try using Afrin nasal spray (or generic oxymetazoline) each morning for about 5 days and then discontinue.  Also recommend using saline nasal spray several times daily and saline nasal irrigation (AYR is a common brand).  Use Flonase nasal spray each morning after using Afrin nasal spray and saline nasal irrigation.       ED Prescriptions   None       Leon Rajas, MD 10/24/23 [phone]

## 2023-10-22 NOTE — ED Triage Notes (Signed)
 Right ear and right sided dental pain x 1 week. Went to dentist and was told it did not appear to be coming from her teeth but was referred to an oral Careers adviser. Wants to make sure it's not an ear infection. No fever. Has taken ibuprofen and ear drops, orajel.

## 2023-10-22 NOTE — Discharge Instructions (Signed)
 No evidence of ear infection today.   Try using Afrin nasal spray (or generic oxymetazoline) each morning for about 5 days and then discontinue.  Also recommend using saline nasal spray several times daily and saline nasal irrigation (AYR is a common brand).  Use Flonase nasal spray each morning after using Afrin nasal spray and saline nasal irrigation.

## 2023-12-15 ENCOUNTER — Other Ambulatory Visit: Payer: Self-pay | Admitting: Physician Assistant

## 2024-01-13 ENCOUNTER — Other Ambulatory Visit: Payer: Self-pay | Admitting: Physician Assistant

## 2024-02-01 ENCOUNTER — Encounter: Payer: Self-pay | Admitting: Physician Assistant

## 2024-02-17 ENCOUNTER — Other Ambulatory Visit: Payer: Self-pay | Admitting: Physician Assistant

## 2024-03-02 ENCOUNTER — Ambulatory Visit: Payer: Self-pay | Admitting: Surgery

## 2024-03-02 DIAGNOSIS — E7800 Pure hypercholesterolemia, unspecified: Secondary | ICD-10-CM | POA: Diagnosis not present

## 2024-03-02 DIAGNOSIS — Z7189 Other specified counseling: Secondary | ICD-10-CM | POA: Diagnosis not present

## 2024-03-02 DIAGNOSIS — Z79899 Other long term (current) drug therapy: Secondary | ICD-10-CM | POA: Diagnosis not present

## 2024-03-02 DIAGNOSIS — Z713 Dietary counseling and surveillance: Secondary | ICD-10-CM | POA: Diagnosis not present

## 2024-03-02 DIAGNOSIS — I10 Essential (primary) hypertension: Secondary | ICD-10-CM | POA: Diagnosis not present

## 2024-03-03 ENCOUNTER — Other Ambulatory Visit (HOSPITAL_COMMUNITY): Payer: Self-pay | Admitting: Surgery

## 2024-03-30 ENCOUNTER — Other Ambulatory Visit: Payer: Self-pay | Admitting: Physician Assistant

## 2024-04-27 ENCOUNTER — Other Ambulatory Visit: Payer: Self-pay | Admitting: Physician Assistant

## 2024-05-01 ENCOUNTER — Ambulatory Visit
Admission: RE | Admit: 2024-05-01 | Discharge: 2024-05-01 | Disposition: A | Discharge status: Home / Self Care | Source: Ambulatory Visit | Attending: Family Medicine | Admitting: Family Medicine

## 2024-05-01 VITALS — BP 130/85 | HR 72 | Temp 98.9°F | Resp 18

## 2024-05-01 DIAGNOSIS — H01009 Unspecified blepharitis unspecified eye, unspecified eyelid: Secondary | ICD-10-CM

## 2024-05-01 MED ORDER — TOBRAMYCIN-DEXAMETHASONE 0.3-0.1 % OP SUSP: 1.0000 [drp] | Freq: Three times a day (TID) | OPHTHALMIC | 0 refills | Status: DC

## 2024-05-01 NOTE — Discharge Instructions (Addendum)
 Tobradex drops 3 x a day for no more than 5 days See your eye doctor if irritation persists  Do not use contact lens until the irritation clears

## 2024-05-01 NOTE — ED Triage Notes (Signed)
 Pt reports right eye irritation since Saturday. States the right eye has been itchy, red and woke up with the right eye very swollen this morning. Denies any vision changes. Does use contacts intermittently. Has tried using a saline solution.

## 2024-05-01 NOTE — ED Provider Notes (Signed)
 Brandi Navarro    CSN: [id number] Arrival date & time: 05/01/24  1316      History   Chief Complaint Chief Complaint  Patient presents with   Eye Problem    Eye is swollen and itchy and is red on the eyelid and underneath - Entered by patient    HPI Brandi Navarro is a 38 y.o. female.   Patient has itching and swelling around the eyelid of her upper lid on the left side only.  She wore contacts a few days ago but has been wearing glasses since.  She does not think it is any allergic reaction to her false lashes.  No visual disturbance.  No eye discomfort.  No drainage.  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   Migraine with aura and without status migrainosus, not intractable 12/19/2017   PMDD (premenstrual dysphoric disorder) 12/19/2017   Seasonal allergic rhinitis due to pollen 12/19/2017   Essential hypertension 12/19/2017   Marijuana user 12/19/2017   Pure hypercholesterolemia 12/19/2017    Past Surgical History:  Procedure Laterality Date   TONSILLECTOMY      OB History     Gravida  1   Para  0   Term  0   Preterm  0   AB  1   Living  0      SAB  0   IAB  1   Ectopic  0   Multiple  0   Live Births               Home Medications    Prior to Admission medications   Medication Sig Start Date End Date Taking? Authorizing Provider  citalopram  (CELEXA ) 20 MG tablet TAKE 1 TABLET BY MOUTH ONCE A DAY,   avoid Ibuprofen with this medication, may cause GI bleeding 06/07/23  Yes Amundson C Silva, Brook E, MD  IBUPROFEN PO Take by mouth.   Yes [provider]  loratadine (CLARITIN) 10 MG tablet Take 10 mg by mouth daily.    Yes [provider]  losartan -hydrochlorothiazide (HYZAAR) 100-25 MG tablet TAKE 1 TABLET BY MOUTH ONCE DAILY --NEEDS  APPOINTMENT 03/30/24  Yes Job Lukes, PA  Multiple Vitamins-Minerals (MULTIVITAMIN PO) Take by mouth daily.   Yes [provider]  tobramycin-dexamethasone (TOBRADEX) ophthalmic solution Place 1 drop into the right eye in the morning, at noon, and at bedtime. 05/01/24  Yes Maranda Jamee Jacob, MD  LORazepam  (ATIVAN ) 0.5 MG tablet Take 1 tablet (0.5 mg total) by mouth at bedtime. Patient taking differently: Take 0.5 mg by mouth at bedtime as needed. 04/08/20   Job Lukes, PA    Family History Family History  Problem Relation Age of Onset   Anemia Mother    Hypertension Father    Heart attack Father    Anemia Sister    Anemia Maternal Grandmother    Diabetes Maternal Grandfather    Hypertension Maternal Grandfather    Prostate cancer Other    Thyroid  cancer Paternal Aunt     Social History Social History   Tobacco Use   Smoking status: Former    Types: E-cigarettes  Smokeless tobacco: Never  Vaping Use   Vaping status: Never Used  Substance Use Topics   Alcohol use: Yes    Alcohol/week: 2.0 - 3.0 standard drinks of alcohol    Types: 2 - 3 Standard drinks or equivalent per week   Drug use: Yes    Types: Marijuana    Comment: daily     Allergies   Patient has no known allergies.   Review of Systems Review of Systems See HPI  Physical Exam Triage Vital Signs ED Triage Vitals  Encounter Vitals Group     BP 05/01/24 1333 130/85     Girls Systolic BP Percentile --      Girls Diastolic BP Percentile --      Boys Systolic BP Percentile --      Boys Diastolic BP Percentile --      Pulse Rate 05/01/24 1333 72     Resp 05/01/24 1333 18     Temp 05/01/24 1333 98.9 F (37.2 C)     Temp Source 05/01/24 1333 Oral     SpO2 05/01/24 1333 96 %     Weight --      Height --      Head Circumference --      Peak Flow --       Pain Score 05/01/24 1334 0     Pain Loc --      Pain Education --      Exclude from Growth Chart --    No data found.  Updated Vital Signs BP 130/85 (BP Location: Right Arm)   Pulse 72   Temp 98.9 F (37.2 C) (Oral)   Resp 18   LMP 04/06/2024 (Exact Date)   SpO2 96%   Visual Acuity Right Eye Distance: 20/15 (with glasses) Left Eye Distance: 20/20 (with glasses) Bilateral Distance: 20/15 (with glasses)       Physical Exam Constitutional:      General: She is not in acute distress.    Appearance: She is well-developed.  HENT:     Head: Normocephalic and atraumatic.  Eyes:     Conjunctiva/sclera: Conjunctivae normal.     Pupils: Pupils are equal, round, and reactive to light.     Comments: Upper eyelid on the left is puffy.  Mildly erythematous.  No hordeolum is seen.  No conjunctival injection.  Cardiovascular:     Rate and Rhythm: Normal rate.  Pulmonary:     Effort: Pulmonary effort is normal. No respiratory distress.  Abdominal:     General: There is no distension.     Palpations: Abdomen is soft.  Musculoskeletal:        General: Normal range of motion.     Cervical back: Normal range of motion.  Skin:    General: Skin is warm and dry.  Neurological:     Mental Status: She is alert.      UC Treatments / Results  Labs (all labs ordered are listed, but only abnormal results are displayed) Labs Reviewed - No data to display  EKG   Radiology No results found.  Procedures Procedures (including critical Navarro time)  Medications Ordered in UC Medications - No data to display  Initial Impression / Assessment and Plan / UC Course  I have reviewed the triage vital signs and the nursing notes.  Pertinent labs & imaging results that were available during my Navarro of the patient were reviewed by me and considered in my medical decision making (see chart for details).  Patient states is her eyelid itches terribly.  She does have some slight photophobia.   No evidence of corneal involvement.  Will treat with antibiotics for a few days and see her eye doctor if her symptoms persist Final Clinical Impressions(s) / UC Diagnoses   Final diagnoses:  Blepharitis due to infection     Discharge Instructions      Tobradex drops 3 x a day for no more than 5 days See your eye doctor if irritation persists  Do not use contact lens until the irritation clears     ED Prescriptions     Medication Sig Dispense Auth. Provider   tobramycin-dexamethasone (TOBRADEX) ophthalmic solution Place 1 drop into the right eye in the morning, at noon, and at bedtime. 2.5 mL Maranda Jamee Jacob, MD      PDMP not reviewed this encounter.   Maranda Jamee Jacob, MD 05/01/24 [phone]

## 2024-05-04 ENCOUNTER — Telehealth: Payer: Self-pay | Admitting: Physician Assistant

## 2024-05-04 MED ORDER — LOSARTAN POTASSIUM-HCTZ 100-25 MG PO TABS: 1.0000 | ORAL_TABLET | Freq: Every day | ORAL | 0 refills | Status: DC

## 2024-05-04 NOTE — Telephone Encounter (Signed)
  Encourage patient to contact the pharmacy for refills or they can request refills through [facility]  LAST APPOINTMENT DATE:  Please schedule appointment if longer than 1 year  NEXT APPOINTMENT DATE: 05/10/24  MEDICATION: losartan -hydrochlorothiazide (HYZAAR) 100-25 MG tablet   Is the patient out of medication? yes  PHARMACY:  [facility] [address] [place], [place] - [address] Phone: [phone]  Fax: [phone]      Let patient know to contact pharmacy at the end of the day to make sure medication is ready.  Please notify patient to allow 48-72 hours to process

## 2024-05-10 ENCOUNTER — Encounter: Payer: Self-pay | Admitting: Physician Assistant

## 2024-05-10 ENCOUNTER — Ambulatory Visit: Payer: Self-pay | Admitting: Physician Assistant

## 2024-05-10 ENCOUNTER — Ambulatory Visit: Admitting: Physician Assistant

## 2024-05-10 VITALS — BP 156/100 | HR 82 | Temp 97.4°F | Ht 64.0 in | Wt 282.5 lb

## 2024-05-10 DIAGNOSIS — G4733 Obstructive sleep apnea (adult) (pediatric): Secondary | ICD-10-CM | POA: Diagnosis not present

## 2024-05-10 DIAGNOSIS — F3281 Premenstrual dysphoric disorder: Secondary | ICD-10-CM | POA: Diagnosis not present

## 2024-05-10 DIAGNOSIS — I10 Essential (primary) hypertension: Secondary | ICD-10-CM | POA: Diagnosis not present

## 2024-05-10 LAB — LIPID PANEL
Cholesterol: 240 mg/dL — ABNORMAL HIGH (ref 0–200)
HDL: 40.4 mg/dL (ref >39.00)
LDL Cholesterol: 165 mg/dL — ABNORMAL HIGH (ref 0–99)
NonHDL: 199.84
Total CHOL/HDL Ratio: 6
Triglycerides: 176 mg/dL — ABNORMAL HIGH (ref 0.0–149.0)
VLDL: 35.2 mg/dL (ref 0.0–40.0)

## 2024-05-10 LAB — CBC WITH DIFFERENTIAL/PLATELET
Basophils Absolute: 0 K/uL (ref 0.0–0.1)
Basophils Relative: 0.7 % (ref 0.0–3.0)
Eosinophils Absolute: 0 K/uL (ref 0.0–0.7)
Eosinophils Relative: 0.5 % (ref 0.0–5.0)
HCT: 41.3 % (ref 36.0–46.0)
Hemoglobin: 13.9 g/dL (ref 12.0–15.0)
Lymphocytes Relative: 20.8 % (ref 12.0–46.0)
Lymphs Abs: 1.5 K/uL (ref 0.7–4.0)
MCHC: 33.5 g/dL (ref 30.0–36.0)
MCV: 94.4 fl (ref 78.0–100.0)
Monocytes Absolute: 0.5 K/uL (ref 0.1–1.0)
Monocytes Relative: 6.5 % (ref 3.0–12.0)
Neutro Abs: 5.1 K/uL (ref 1.4–7.7)
Neutrophils Relative %: 71.5 % (ref 43.0–77.0)
Platelets: 335 K/uL (ref 150.0–400.0)
RBC: 4.38 Mil/uL (ref 3.87–5.11)
RDW: 13.6 % (ref 11.5–15.5)
WBC: 7.1 K/uL (ref 4.0–10.5)

## 2024-05-10 LAB — HEMOGLOBIN A1C: Hgb A1c MFr Bld: 5.4 % (ref 4.6–6.5)

## 2024-05-10 LAB — COMPREHENSIVE METABOLIC PANEL WITH GFR
ALT: 35 U/L (ref 0–35)
AST: 27 U/L (ref 0–37)
Albumin: 4.3 g/dL (ref 3.5–5.2)
Alkaline Phosphatase: 94 U/L (ref 39–117)
BUN: 14 mg/dL (ref 6–23)
CO2: 27 meq/L (ref 19–32)
Calcium: 9.3 mg/dL (ref 8.4–10.5)
Chloride: 103 meq/L (ref 96–112)
Creatinine, Ser: 0.84 mg/dL (ref 0.40–1.20)
GFR: 89.39 mL/min (ref >60.00)
Glucose, Bld: 87 mg/dL (ref 70–99)
Potassium: 3.6 meq/L (ref 3.5–5.1)
Sodium: 139 meq/L (ref 135–145)
Total Bilirubin: 0.5 mg/dL (ref 0.2–1.2)
Total Protein: 7.2 g/dL (ref 6.0–8.3)

## 2024-05-10 MED ORDER — ZEPBOUND 2.5 MG/0.5ML ~~LOC~~ SOAJ: 2.5000 mg | SUBCUTANEOUS | 0 refills | Status: DC

## 2024-05-10 MED ORDER — LOSARTAN POTASSIUM-HCTZ 100-25 MG PO TABS: 1.0000 | ORAL_TABLET | Freq: Every day | ORAL | 1 refills | Status: AC

## 2024-05-10 NOTE — Patient Instructions (Addendum)
 It was great to see you!  Your blood pressure is elevated in our office today.  I recommend that you monitor this at home.  Your goal blood pressure should be around < 130/80, unless you are over 36 years old, your goal may be closer to 140-150/90. Please note if you have been given other goals from a cardiologist or other healthcare provider, please defer to their recommendations.  When preparing to take your blood pressure: Plan ahead. Don't smoke, drink caffeine or exercise within 30 minutes before taking your blood pressure. Empty your bladder. Don't take the measurement over clothes. Remove the clothing over the arm that will be used to measure blood pressure. You can use either arm unless otherwise told by a healthcare provider. Usually there is not a big difference between readings on them. Be still. Allow at least five minutes of quiet rest before measurements. Don't talk or use the phone. Sit correctly. Sit with your back straight and supported (on a dining chair, rather than a sofa). Your feet should be flat on the floor. Do not cross your legs. Support your arm on a flat surface. The middle of the cuff should be placed on the upper arm at heart level.  Measure at the same time of the day. Take multiple readings and record the results. Each time you measure, take two readings one minute apart. Record the results and bring in to your next office visit.  In order to know how well the medication is working, I would like you to take your readings 1-2 hours after taking your blood pressure medication if possible. Take your blood pressure measurements and record 2-3 days per week. Message me in 1-2 weeks with your average readings so I can advise further!  If you get a high blood pressure reading: A single high reading is not an immediate cause for alarm. If you get a reading that is higher than normal, take your blood pressure a second time. Write down the results of both measurements. Check  with your health care professional to see if there's a health concern or whether there may be problems with your monitor. If your blood pressure readings are suddenly higher than 180/120 mm Hg, wait at least one minute and test again. If your readings are still very high, contact your health care professional immediately. You could be having a hypertensive crisis. Call 911 if your blood pressure is higher than 180/120 mm Hg and if you are having new signs or symptoms that may include: Chest pain Shortness of breath Back pain Numbness Weakness Change in vision Difficulty speaking Confusion Dizziness Vomiting

## 2024-05-10 NOTE — Progress Notes (Signed)
 Lindyn Vossler is a 38 y.o. female here for a follow up of a pre-existing problem.  History of Present Illness:   Chief Complaint  Patient presents with   Hypertension    Pt needs refill   Weight Management Screening    Pt wants to discuss weight loss options.    Discussed the use of AI scribe software for clinical note transcription with the patient, who gave verbal consent to proceed.  History of Present Illness   Makynleigh Breslin is a 38 year old female with hypertension and obesity who presents for follow-up on blood pressure management and weight loss options.  She did not take her blood pressure medication this morning and has not been consistently monitoring her blood pressure at home. She has not experienced symptoms of hypertension since an episode last year. Her current medications include losartan  100 mg - hydrochlorothiazide 25 mg daily, which she did not take today. She experiences ankle swelling, attributed to a previous injury.  She is considering bariatric surgery and has met with a surgeon to discuss gastric bypass due to severe acid reflux and PCOS. She had a referral to a dietitian as part of the pre-surgical protocol. She has not tried new weight loss medications except for phentermine  in the past and is unsure about insurance coverage for newer medications. No recent blood work has been done to check for diabetes or prediabetes.  She takes citalopram  20 mg for mental health, which stabilizes her mood, though she experiences fluctuations around her menstrual cycle. She consumes alcohol infrequently but in large quantities, does not use tobacco, and avoids caffeine due to heart palpitations. She prefers salty foods, which may impact her blood pressure.     Home Sleep Test from 03/29/2018 This home sleep test demonstrates moderate  obstructive sleep apnea (by number of events) with a total AHI of  18.2/hour and O2 nadir of 88%.    Past Medical History:  Diagnosis Date    Allergy    Anxiety    Only take meds as needed   Asthma    well controlled; never on scheduled inhalers   GERD (gastroesophageal reflux disease)    HSV2    Hypertension    Migraines with aura      Social History   Tobacco Use   Smoking status: Former    Types: E-cigarettes   Smokeless tobacco: Never  Vaping Use   Vaping status: Never Used  Substance Use Topics   Alcohol use: Yes    Alcohol/week: 2.0 - 3.0 standard drinks of alcohol    Types: 2 - 3 Standard drinks or equivalent per week   Drug use: Yes    Types: Marijuana    Comment: daily    Past Surgical History:  Procedure Laterality Date   TONSILLECTOMY      Family History  Problem Relation Age of Onset   Anemia Mother    Cancer Mother    Varicose Veins Mother    Hypertension Father    Heart attack Father    Anemia Sister    Varicose Veins Sister    Anemia Maternal Grandmother    Diabetes Maternal Grandfather    Hypertension Maternal Grandfather    Prostate cancer Other    Thyroid  cancer Paternal Aunt    Cancer Maternal Aunt    Varicose Veins Maternal Aunt     No Known Allergies  Current Medications:   Current Outpatient Medications:    citalopram  (CELEXA ) 20 MG tablet, TAKE 1 TABLET BY MOUTH  ONCE A DAY,   avoid Ibuprofen with this medication, may cause GI bleeding, Disp: 90 tablet, Rfl: 3   IBUPROFEN PO, Take by mouth., Disp: , Rfl:    loratadine (CLARITIN) 10 MG tablet, Take 10 mg by mouth daily., Disp: , Rfl:    LORazepam  (ATIVAN ) 0.5 MG tablet, Take 1 tablet (0.5 mg total) by mouth at bedtime. (Patient taking differently: Take 0.5 mg by mouth at bedtime as needed.), Disp: 30 tablet, Rfl: 0   Multiple Vitamins-Minerals (MULTIVITAMIN PO), Take by mouth daily., Disp: , Rfl:    valACYclovir  (VALTREX ) 500 MG tablet, Take 500 mg by mouth daily., Disp: , Rfl:    losartan -hydrochlorothiazide (HYZAAR) 100-25 MG tablet, Take 1 tablet by mouth daily., Disp: 90 tablet, Rfl: 1   Review of Systems:    Negative unless otherwise specified per HPI.  Vitals:   Vitals:   05/10/24 0954 05/10/24 1033  BP: (!) 160/100 (!) 156/100  Pulse: 82   Temp: (!) 97.4 F (36.3 C)   TempSrc: Temporal   Weight: 282 lb 8 oz (128.1 kg)   Height: 5' 4 (1.626 m)      Body mass index is 48.49 kg/m.  Physical Exam:   Physical Exam Vitals and nursing note reviewed.  Constitutional:      General: She is not in acute distress.    Appearance: She is well-developed. She is not ill-appearing or toxic-appearing.  Cardiovascular:     Rate and Rhythm: Normal rate and regular rhythm.     Pulses: Normal pulses.     Heart sounds: Normal heart sounds, S1 normal and S2 normal.  Pulmonary:     Effort: Pulmonary effort is normal.     Breath sounds: Normal breath sounds.  Skin:    General: Skin is warm and dry.  Neurological:     Mental Status: She is alert.     GCS: GCS eye subscore is 4. GCS verbal subscore is 5. GCS motor subscore is 6.  Psychiatric:        Speech: Speech normal.        Behavior: Behavior normal. Behavior is cooperative.     Assessment and Plan:   Assessment and Plan    Hypertension Elevated blood pressure due to missed medication. No recent severe episodes. Above goal today No evidence of end-organ damage on my exam Recommend patient monitor home blood pressure at least a few times weekly Continue losartan  100 mg - hydrochlorothiazide 25  mg daily If home monitoring shows consistent elevation, or any symptom(s) develop, recommend reach out to us  for further advice on next steps  Morbid obesity; Moderate obstructive sleep apnea  Obesity with moderate obstructive sleep apnea and PCOS. Discussed weight loss medications, including Wegovy or Zepbound, with insurance and self-pay options. Emphasized weight loss benefits. - Submitted prescription for Wegovy or Zepbound. - Checked A1c to rule out diabetes. - Provided handout on self-pay options. - Monitor weight loss progress and  adjust medication as needed.  PMDD  Well-managed on citalopram . Stable with occasional mood fluctuations. - Continue citalopram  at current dose.  General Health Maintenance Discussed importance of monitoring blood pressure and weight. Encouraged lifestyle modifications. - Encouraged lifestyle modifications to support weight loss and blood pressure control.         Lucie Buttner, PA-C

## 2024-05-12 ENCOUNTER — Telehealth: Payer: Self-pay | Admitting: *Deleted

## 2024-05-12 ENCOUNTER — Telehealth: Payer: Self-pay

## 2024-05-12 ENCOUNTER — Other Ambulatory Visit (HOSPITAL_COMMUNITY): Payer: Self-pay

## 2024-05-12 NOTE — Telephone Encounter (Signed)
 Lucie, please see message from [place].

## 2024-05-12 NOTE — Telephone Encounter (Signed)
 Please do PA for Zepbound 2.5 mg for Moderate Obstructive Sleep Apnea, Morbid Obesity and Hypertension.

## 2024-05-12 NOTE — Telephone Encounter (Signed)
 Brandi Navarro, Zepbound has been denied. Please advise.

## 2024-05-12 NOTE — Telephone Encounter (Signed)
 Pharmacy Patient Advocate Encounter   Received notification from Pt Calls Messages that prior authorization for Zepbound 2.5MG /0.5ML Auto-injectors is required/requested.   Insurance verification completed.   The patient is insured through [employer].   Per test claim:

## 2024-05-12 NOTE — Telephone Encounter (Signed)
 [name], was this done under obstructive sleep apnea? Sleep test is under Media tab done 03/08/2018.

## 2024-05-22 ENCOUNTER — Encounter: Payer: Self-pay | Admitting: Physician Assistant

## 2024-05-22 ENCOUNTER — Other Ambulatory Visit: Payer: Self-pay | Admitting: Physician Assistant

## 2024-05-22 MED ORDER — AMLODIPINE BESYLATE 5 MG PO TABS: 5.0000 mg | ORAL_TABLET | Freq: Every day | ORAL | 1 refills | Status: DC

## 2024-06-07 ENCOUNTER — Ambulatory Visit: Payer: Commercial Managed Care - HMO | Admitting: Obstetrics and Gynecology

## 2024-06-07 ENCOUNTER — Encounter: Payer: Self-pay | Admitting: Obstetrics and Gynecology

## 2024-06-07 ENCOUNTER — Other Ambulatory Visit (HOSPITAL_COMMUNITY)
Admission: RE | Admit: 2024-06-07 | Discharge: 2024-06-07 | Disposition: A | Discharge status: Home / Self Care | Source: Ambulatory Visit | Attending: Obstetrics and Gynecology | Admitting: Obstetrics and Gynecology

## 2024-06-07 VITALS — BP 118/82 | HR 92 | Ht 66.0 in | Wt 286.0 lb

## 2024-06-07 DIAGNOSIS — Z113 Encounter for screening for infections with a predominantly sexual mode of transmission: Secondary | ICD-10-CM

## 2024-06-07 DIAGNOSIS — Z124 Encounter for screening for malignant neoplasm of cervix: Secondary | ICD-10-CM

## 2024-06-07 DIAGNOSIS — Z01419 Encounter for gynecological examination (general) (routine) without abnormal findings: Secondary | ICD-10-CM

## 2024-06-07 DIAGNOSIS — Z114 Encounter for screening for human immunodeficiency virus [HIV]: Secondary | ICD-10-CM | POA: Diagnosis not present

## 2024-06-07 DIAGNOSIS — Z1159 Encounter for screening for other viral diseases: Secondary | ICD-10-CM | POA: Diagnosis not present

## 2024-06-07 DIAGNOSIS — Z1331 Encounter for screening for depression: Secondary | ICD-10-CM | POA: Diagnosis not present

## 2024-06-07 MED ORDER — VALACYCLOVIR HCL 500 MG PO TABS: 500.0000 mg | ORAL_TABLET | Freq: Every day | ORAL | 11 refills | Status: AC

## 2024-06-07 MED ORDER — CITALOPRAM HYDROBROMIDE 10 MG PO TABS: ORAL_TABLET | ORAL | 11 refills | Status: AC

## 2024-06-07 NOTE — Patient Instructions (Signed)

## 2024-06-07 NOTE — Progress Notes (Signed)
 38 y.o. [id number] Single Caucasian female here for annual exam. Wants STD testing    Best friend and father's wife passed away.   Celexa  20 mg is working well.  Some PMS symptoms leading up to her menstrual cycle.  Finds herself more emotional during that time.  Would consider increasing her Celexa  during her premenstrual period.  Bled with Micronor .  Pain with Kyleena .  Declines prescription contraception today.   PCP: Job Lukes, PA   Patient's last menstrual period was 05/08/2024 (exact date).     Period Cycle (Days): 28 Period Duration (Days): 5-8 Period Pattern: Regular Menstrual Flow: Heavy Menstrual Control: Tampon Dysmenorrhea: (!) Severe Dysmenorrhea Symptoms: Cramping     Sexually active: Yes.    The current method of family planning is condoms sometimes.    Menopausal hormone therapy:  n/a Exercising: No.   Smoker:  Former   OB History  Gravida Para Term Preterm AB Living  1 0 0 0 1 0  SAB IAB Ectopic Multiple Live Births  0 1 0 0     # Outcome Date GA Lbr Len/2nd Weight Sex Type Anes PTL Lv  1 IAB              HEALTH MAINTENANCE: Last 2 paps:  02/14/20 neg, 01/11/19 neg HPV neg  History of abnormal Pap or positive HPV:  no Mammogram:   n/a Colonoscopy:  n/a Bone Density:  n/a  Result  n/a   Immunization History  Administered Date(s) Administered   HPV Quadrivalent 11/17/2011, 01/18/2012, 05/20/2012   Tdap 06/29/2005, 12/10/2016      reports that she has quit smoking. Her smoking use included e-cigarettes. She has never used smokeless tobacco. She reports current alcohol use of about 2.0 - 3.0 standard drinks of alcohol per week. She reports current drug use. Drug: Marijuana.  Past Medical History:  Diagnosis Date   Allergy    Anxiety    Only take meds as needed   Asthma    well controlled; never on scheduled inhalers   GERD (gastroesophageal reflux disease)    HSV2    Hypertension    Migraines with aura     Past Surgical History:   Procedure Laterality Date   TONSILLECTOMY      Current Outpatient Medications  Medication Sig Dispense Refill   amLODipine  (NORVASC ) 5 MG tablet Take 1 tablet (5 mg total) by mouth daily. 30 tablet 1   citalopram  (CELEXA ) 20 MG tablet TAKE 1 TABLET BY MOUTH ONCE A DAY,   avoid Ibuprofen with this medication, may cause GI bleeding 90 tablet 3   IBUPROFEN PO Take by mouth.     loratadine (CLARITIN) 10 MG tablet Take 10 mg by mouth daily.     LORazepam  (ATIVAN ) 0.5 MG tablet Take 1 tablet (0.5 mg total) by mouth at bedtime. (Patient taking differently: Take 0.5 mg by mouth at bedtime as needed.) 30 tablet 0   losartan -hydrochlorothiazide (HYZAAR) 100-25 MG tablet Take 1 tablet by mouth daily. 90 tablet 1   Multiple Vitamins-Minerals (MULTIVITAMIN PO) Take by mouth daily.     valACYclovir  (VALTREX ) 500 MG tablet Take 500 mg by mouth daily.     No current facility-administered medications for this visit.    ALLERGIES: Patient has no known allergies.  Family History  Problem Relation Age of Onset   Anemia Mother    Cancer Mother    Varicose Veins Mother    Hypertension Father    Heart attack Father    Anemia  Sister    Varicose Veins Sister    Anemia Maternal Grandmother    Diabetes Maternal Grandfather    Hypertension Maternal Grandfather    Prostate cancer Other    Thyroid  cancer Paternal Aunt    Cancer Maternal Aunt    Varicose Veins Maternal Aunt     Review of Systems  All other systems reviewed and are negative.   PHYSICAL EXAM:  BP 118/82 (BP Location: Left Arm, Patient Position: Sitting)   Pulse 92   Ht 5' 6 (1.676 m)   Wt 286 lb (129.7 kg)   LMP 05/08/2024 (Exact Date)   SpO2 99%   BMI 46.16 kg/m     General appearance: alert, cooperative and appears stated age Head: normocephalic, without obvious abnormality, atraumatic Neck: no adenopathy, supple, symmetrical, trachea midline and thyroid  normal to inspection and palpation Lungs: clear to auscultation  bilaterally Breasts: normal appearance, no masses or tenderness, No nipple retraction or dimpling, No nipple discharge or bleeding, No axillary adenopathy Heart: regular rate and rhythm Abdomen: soft, non-tender; no masses, no organomegaly Extremities: extremities normal, atraumatic, no cyanosis or edema Skin: skin color, texture, turgor normal. No rashes or lesions Lymph nodes: cervical, supraclavicular, and axillary nodes normal. Neurologic: grossly normal  Pelvic: External genitalia:  no lesions              No abnormal inguinal nodes palpated.              Urethra:  normal appearing urethra with no masses, tenderness or lesions              Bartholins and Skenes: normal                 Vagina: normal appearing vagina with normal color and discharge, no lesions              Cervix: no lesions              Pap taken: yes Bimanual Exam:  Uterus:  normal size, contour, position, consistency, mobility, non-tender              Adnexa: no mass, fullness, tenderness      Chaperone was present for exam:  Kari HERO, CMA  ASSESSMENT: Well woman visit with gynecologic exam. HTN.  Migraine with aura.  HSV 2.  Hx chlamydia.  STD screening.  Depression and anxiety.  PMS symptoms.  FH of possible uterine cancer in her mother.   PHQ-2-9: 0  PLAN: Mammogram screening age 70 yo. Self breast awareness reviewed. Pap and HRV collected:  yes Guidelines for Calcium, Vitamin D , regular exercise program including cardiovascular and weight bearing exercise. STD screening.  Medication refills:  Celexa  20 mg daily for first 2 weeks per month and then 30 mg daily for the last 2 weeks per month.  #70, RF 11.  She will let me know if she is having side effects of the increased dose during her premenstrual period.   Valtrex  500 mg daily.  #30, RF 11.  Follow up:  yearly and prn.

## 2024-06-08 LAB — SYPHILIS: RPR W/REFLEX TO RPR TITER AND TREPONEMAL ANTIBODIES, TRADITIONAL SCREENING AND DIAGNOSIS ALGORITHM: RPR Ser Ql: NONREACTIVE

## 2024-06-08 LAB — HIV ANTIBODY (ROUTINE TESTING W REFLEX)
HIV 1&2 Ab, 4th Generation: NONREACTIVE
HIV FINAL INTERPRETATION: NEGATIVE

## 2024-06-08 LAB — HEPATITIS C ANTIBODY: Hepatitis C Ab: NONREACTIVE

## 2024-06-09 LAB — CYTOLOGY - PAP
Chlamydia: NEGATIVE
Comment: NEGATIVE
Comment: NEGATIVE
Comment: NEGATIVE
Comment: NORMAL
Diagnosis: NEGATIVE
Diagnosis: REACTIVE
High risk HPV: NEGATIVE
Neisseria Gonorrhea: NEGATIVE
Trichomonas: NEGATIVE

## 2024-06-11 ENCOUNTER — Ambulatory Visit: Payer: Self-pay | Admitting: Obstetrics and Gynecology

## 2024-06-12 ENCOUNTER — Other Ambulatory Visit: Payer: Self-pay | Admitting: Obstetrics and Gynecology

## 2024-06-12 MED ORDER — METRONIDAZOLE 500 MG PO TABS: 500.0000 mg | ORAL_TABLET | Freq: Two times a day (BID) | ORAL | 0 refills | Status: AC

## 2024-06-12 NOTE — Telephone Encounter (Signed)
 I sent in a prescription for the Flagyl  to her pharmacy.

## 2024-07-15 ENCOUNTER — Other Ambulatory Visit: Payer: Self-pay | Admitting: Physician Assistant

## 2024-07-26 ENCOUNTER — Encounter: Payer: Self-pay | Admitting: Physician Assistant

## 2024-08-02 ENCOUNTER — Ambulatory Visit: Payer: Self-pay | Admitting: Physician Assistant

## 2025-06-12 ENCOUNTER — Ambulatory Visit: Admitting: Obstetrics and Gynecology
